# Patient Record
Sex: Female | Born: 1945
Health system: Southern US, Community
[De-identification: ages and names within clinical notes are randomized; demographics above are authoritative.]

## PROBLEM LIST (undated history)

## (undated) DIAGNOSIS — I10 Essential (primary) hypertension: Secondary | ICD-10-CM

## (undated) DIAGNOSIS — E079 Disorder of thyroid, unspecified: Secondary | ICD-10-CM

## (undated) DIAGNOSIS — E785 Hyperlipidemia, unspecified: Secondary | ICD-10-CM

## (undated) HISTORY — PX: BREAST EXCISIONAL BIOPSY: SUR124

## (undated) HISTORY — PX: BREAST BIOPSY: SHX20

---

## 2002-10-29 ENCOUNTER — Emergency Department (HOSPITAL_COMMUNITY): Admission: EM | Admit: 2002-10-29 | Discharge: 2002-10-29 | Payer: Self-pay | Admitting: *Deleted

## 2002-10-29 ENCOUNTER — Encounter: Payer: Self-pay | Admitting: *Deleted

## 2002-11-04 ENCOUNTER — Ambulatory Visit (HOSPITAL_COMMUNITY): Admission: RE | Admit: 2002-11-04 | Discharge: 2002-11-04 | Payer: Self-pay | Admitting: Family Medicine

## 2002-11-04 ENCOUNTER — Encounter: Payer: Self-pay | Admitting: Family Medicine

## 2005-09-28 ENCOUNTER — Emergency Department (HOSPITAL_COMMUNITY): Admission: EM | Admit: 2005-09-28 | Discharge: 2005-09-28 | Payer: Self-pay | Admitting: Family Medicine

## 2006-08-23 ENCOUNTER — Encounter: Admission: RE | Admit: 2006-08-23 | Discharge: 2006-08-23 | Payer: Self-pay | Admitting: Occupational Medicine

## 2006-09-13 ENCOUNTER — Encounter: Admission: RE | Admit: 2006-09-13 | Discharge: 2006-09-13 | Payer: Self-pay | Admitting: Occupational Medicine

## 2006-09-18 ENCOUNTER — Encounter: Admission: RE | Admit: 2006-09-18 | Discharge: 2006-09-18 | Payer: Self-pay | Admitting: Occupational Medicine

## 2006-09-18 ENCOUNTER — Encounter (INDEPENDENT_AMBULATORY_CARE_PROVIDER_SITE_OTHER): Payer: Self-pay | Admitting: Specialist

## 2007-02-28 ENCOUNTER — Encounter: Admission: RE | Admit: 2007-02-28 | Discharge: 2007-02-28 | Payer: Self-pay | Admitting: Family Medicine

## 2007-05-08 ENCOUNTER — Ambulatory Visit (HOSPITAL_COMMUNITY): Admission: RE | Admit: 2007-05-08 | Discharge: 2007-05-08 | Payer: Self-pay | Admitting: Family Medicine

## 2008-06-25 ENCOUNTER — Encounter: Admission: RE | Admit: 2008-06-25 | Discharge: 2008-06-25 | Payer: Self-pay | Admitting: Family Medicine

## 2009-07-15 ENCOUNTER — Encounter: Admission: RE | Admit: 2009-07-15 | Discharge: 2009-07-15 | Payer: Self-pay | Admitting: Family Medicine

## 2009-12-31 ENCOUNTER — Emergency Department (HOSPITAL_COMMUNITY): Admission: EM | Admit: 2009-12-31 | Discharge: 2009-12-31 | Payer: Self-pay | Admitting: Emergency Medicine

## 2009-12-31 ENCOUNTER — Encounter: Payer: Self-pay | Admitting: Orthopedic Surgery

## 2010-01-02 ENCOUNTER — Ambulatory Visit: Payer: Self-pay | Admitting: Orthopedic Surgery

## 2010-01-02 DIAGNOSIS — IMO0002 Reserved for concepts with insufficient information to code with codable children: Secondary | ICD-10-CM | POA: Insufficient documentation

## 2010-01-03 ENCOUNTER — Telehealth: Payer: Self-pay | Admitting: Orthopedic Surgery

## 2010-01-03 ENCOUNTER — Encounter: Payer: Self-pay | Admitting: Orthopedic Surgery

## 2010-01-04 ENCOUNTER — Telehealth (INDEPENDENT_AMBULATORY_CARE_PROVIDER_SITE_OTHER): Payer: Self-pay | Admitting: *Deleted

## 2010-01-05 ENCOUNTER — Ambulatory Visit (HOSPITAL_COMMUNITY): Admission: RE | Admit: 2010-01-05 | Discharge: 2010-01-05 | Payer: Self-pay | Admitting: Orthopedic Surgery

## 2010-01-09 ENCOUNTER — Encounter (INDEPENDENT_AMBULATORY_CARE_PROVIDER_SITE_OTHER): Payer: Self-pay | Admitting: *Deleted

## 2010-01-09 ENCOUNTER — Ambulatory Visit: Payer: Self-pay | Admitting: Orthopedic Surgery

## 2010-01-09 DIAGNOSIS — S83419A Sprain of medial collateral ligament of unspecified knee, initial encounter: Secondary | ICD-10-CM | POA: Insufficient documentation

## 2010-01-13 ENCOUNTER — Encounter: Payer: Self-pay | Admitting: Orthopedic Surgery

## 2010-01-16 ENCOUNTER — Telehealth: Payer: Self-pay | Admitting: Orthopedic Surgery

## 2010-01-24 ENCOUNTER — Encounter: Payer: Self-pay | Admitting: Orthopedic Surgery

## 2010-02-13 ENCOUNTER — Ambulatory Visit: Payer: Self-pay | Admitting: Orthopedic Surgery

## 2010-02-13 DIAGNOSIS — S83006A Unspecified dislocation of unspecified patella, initial encounter: Secondary | ICD-10-CM | POA: Insufficient documentation

## 2010-07-18 ENCOUNTER — Encounter
Admission: RE | Admit: 2010-07-18 | Discharge: 2010-07-18 | Payer: Self-pay | Source: Home / Self Care | Attending: Internal Medicine | Admitting: Internal Medicine

## 2010-08-31 NOTE — Assessment & Plan Note (Signed)
Summary: left knee torn lig/to get brace/bsf   Visit Type:  Follow-up Referring Provider:  ap er Primary Provider:  Dr. Nobie Putnam  CC:  left knee pain.  History of Present Illness: I saw Ann Ortega in the office today for a followup visit.  She is a 65 years old woman with the complaint of:  left knee.  Medications: Ibuprofen.  IMPRESSION:   1.  The dominant finding is a torn proximal portion of the medial collateral ligament.  There is adjacent indistinctness of the medial patellar retinaculum and medial patellofemoral ligament, which may also be torn. 2.  Trace knee effusion. 3.  Non fragmented osteochondral lesion along the posterior portion the lateral femoral condyle.  There is a nearby chondral defect. 4.  Non fragmented osteochondral lesion of the inferior portion lateral patellar facet. 5.  Small Baker's cyst.   Read By:  Dellia Cloud,  M.D.      Allergies: No Known Drug Allergies   Impression & Recommendations:  Problem # 1:  MEDIAL MENISCUS TEAR, LEFT (ICD-836.0) Assessment Improved  brace check  Orders: No Charge Patient Arrived (NCPA0) (NCPA0)  Problem # 2:  TEAR M C L (ICD-844.1) brace fits fine continue brace for 2 weeks if she is ready to go back to work in 2 weeks she will call and get her  note for return to work   Patient Instructions: 1)  continue brace for 5 weeks 2)  come back in 5 weeks 3)  OOW 2 more weeks

## 2010-08-31 NOTE — Progress Notes (Signed)
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 She's to bring back our [employer] forms/fee as per previous conversation for completion. Initial call taken by: Cammie Sickle,  January 04, 2010 1:28 PM

## 2010-08-31 NOTE — Letter (Signed)
Summary: History form  History form   Imported By: Jacklynn Ganong 01/12/2010 15:29:07  _____________________________________________________________________  External Attachment:    Type:   Image     Comment:   External Document

## 2010-08-31 NOTE — Assessment & Plan Note (Signed)
Summary: [facility] FOL/UP/LT KNEE SPRAIN/XR AP 12/31/09/BCBS/CAF   Vital Signs:  Patient profile:   65 year old female Height:      62 inches Weight:      151 pounds Pulse rate:   72 / minute Resp:     16 per minute  Vitals Entered By: Fuller Canada MD (January 02, 2010 11:00 AM)  Visit Type:  new patient Referring Provider:  ap er Primary Provider:  Dr. Nobie Putnam  CC:  left knee pain.  History of Present Illness: I saw Ann Ortega in the office today for an initial visit.  She is a 65 years old woman with the complaint of:  left knee pain. Grade 5 / 10 medial knee pain non radiating , associated with swelling  DOI 12/31/09 fell.  Xrays [facility] left knee 12/31/09.  Meds: Vicodin 5, Omeprazole, Mobic, Synthroid, Robaxin.       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 Cardiovascular:  Denies chest pain, palpitations, fainting, and murmurs. Respiratory:  Complains of couch; denies short of breath, wheezing, tightness, pain on inspiration, and snoring . Gastrointestinal:  Denies heartburn, nausea, vomiting, diarrhea, constipation, and blood in your stools. Genitourinary:  Denies frequency, urgency, difficulty urinating, painful urination, flank pain, and bleeding in urine. Neurologic:  Denies numbness, tingling, unsteady gait, dizziness, tremors, and seizure. Musculoskeletal:  Denies joint pain, swelling, instability, stiffness, redness, heat, and muscle pain. Endocrine:  Denies excessive thirst, exessive urination, and heat or cold intolerance. Psychiatric:  Denies nervousness, depression, anxiety, and hallucinations. Skin:  Denies changes in the skin, poor healing, rash, itching, and  redness. HEENT:  Denies blurred or double vision, eye pain, redness, and watering. Immunology:  Denies seasonal allergies, sinus problems, and allergic to bee stings. Hemoatologic:  Denies easy bleeding and brusing.  Physical Exam  Skin:  intact without lesions or rashes Inguinal Nodes:  no significant adenopathy Psych:  alert and cooperative; normal mood and affect; normal attention span and concentration   Knee Exam  General:    Well-developed, well-nourished, normal body habitus; no deformities, normal grooming.  Gait:    she is using crutches with partial weightbearing with the brace on the LEFT leg  Skin:    Intact, no scars, lesions, rashes, cafe au lait spots, or bruising.    Inspection:    ntender RIGHT knee no swelling  Swelling LEFT knee medial joint line tenderness severe  Vascular:    There was no swelling or varicose veins. The pulses and temperature are normal. There was no edema or tenderness.  Sensory:    Gross coordination and sensation were normal.    Motor:    Motor strength 5/5 bilaterally for quadriceps, hamstrings, ankle dorsiflexion, and ankle plantar flexion.    Reflexes:    Normal and symmetric patellar and Achilles reflexes bilaterally.    Knee Exam:    Right:    Inspection/Palpation:  full range of motion RIGHT knee with normal strength normal stability and no swelling or tenderness    Left:    Inspection/Palpation:  LEFT knee medial joint line tenderness severe mild effusion range of motion passively 120.  Motor exam normal.  Knee stable.      LEFT knee positive McMurray sign  Impression & Recommendations:  Problem # 1:  MEDIAL MENISCUS TEAR, LEFT (ICD-836.0) Assessment New  x-rays from the emergency room include 4 views of the LEFT knee there is mild joint arthritis.  Strong suspicion for torn medial meniscus with positive Murray sign and medial joint line tenderness with twisting injury recommend MRI LEFT knee  Orders: New  Patient Level III ([address])  Patient Instructions: 1)  Wrap style econ hinge  2)  Ice three times a day for 20 min  3)  ibuprofen 800 yid  4)  norco 5 mg 1 q 6 as needed pain  5)  MRI left knee  6)  Apply weight to the left knee as tolerated  7)  OOW until mri results are given

## 2010-08-31 NOTE — Letter (Signed)
Summary: Out of Work  [facility]  [address]  [place], [place] [address]   Phone: [phone]  Fax: [phone]    January 24, 2010   Employee:  Ann Ortega    To Whom It May Concern:   For Medical reasons, please excuse/continue out of work status regarding the above named employee from work for the following dates:  Start:   January 23, 2010  End:   February 13, 2010              Next scheduled appointment:  February 13, 2010   If you need additional information, please feel free to contact our office.         Sincerely,    Terrance Mass, MD

## 2010-08-31 NOTE — Letter (Signed)
Summary: Out of Work  [facility]  [address]  [place], [place] [address]   Phone: [phone]  Fax: [phone]    January 09, 2010   Employee:  IRANIA DURELL    To Whom It May Concern:   For Medical reasons, please excuse / continue out of work status, for the above named employee from work for the following dates:  Start:                        January 09, 2010  End/Return to work :    January 23, 2010   If you need additional information, please feel free to contact our office.         Sincerely,    Terrance Mass, MD

## 2010-08-31 NOTE — Progress Notes (Signed)
Summary: MRI appointment at [facility].  Phone Note Outgoing Call   Call placed by: Waldon Reining,  January 03, 2010 2:20 PM Call placed to: Patient Action Taken: Appt scheduled Summary of Call: I called to give the patient her MRI appointment at [facility] on 01-05-10 at 7:30 am. Patient has [employer], authorization [phone]. Patient will follow up here for results.

## 2010-08-31 NOTE — Progress Notes (Signed)
Summary: re-sent [employer] disab form and notes  Phone Note Outgoing Call   Call placed to: Insurer Summary of Call: Re-faxed [employer] dis[employer] form, per request phone msg Jones Broom, ins.representative; re-sent [employer]'s form + office notes + work note  to Fax [phone] / ph [phone] Initial call taken by: Cammie Sickle,  January 16, 2010 6:03 PM

## 2010-08-31 NOTE — Letter (Signed)
Summary: Out of Work  [facility]  [address]  [place], [place] [address]   Phone: [phone]  Fax: [phone]    February 13, 2010   Employee:  FRANKIE ZITO    To Whom It May Concern:   For Medical reasons, please note that the above named employee may return to work per the following dates:    End/Return to work, full duty, no restrictions:   02/13/10  If you need additional information, please feel free to contact our office.         Sincerely,    Terrance Mass, MD
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
## 2010-08-31 NOTE — Letter (Signed)
Summary: [employer] disability form  [employer] disability form   Imported By: Cammie Sickle 01/16/2010 18:01:43  _____________________________________________________________________  External Attachment:    Type:   Image     Comment:   External Document

## 2010-08-31 NOTE — Letter (Signed)
Summary: Out of Work  [facility]  [address]  [place], [place] [address]   Phone: [phone]  Fax: [phone]    January 02, 2010   Employee:  KAREL MOWERS    To Whom It May Concern:   For Medical reasons, please excuse the above named employee from work for the following dates:  Start: June 6th     End:   thru June 13   If you need additional information, please feel free to contact our office.         Sincerely,    Fuller Canada MD

## 2011-07-02 ENCOUNTER — Other Ambulatory Visit: Payer: Self-pay | Admitting: Internal Medicine

## 2011-07-02 DIAGNOSIS — Z1231 Encounter for screening mammogram for malignant neoplasm of breast: Secondary | ICD-10-CM

## 2011-07-27 ENCOUNTER — Ambulatory Visit: Payer: Self-pay

## 2011-08-17 ENCOUNTER — Ambulatory Visit: Payer: Self-pay

## 2013-05-26 ENCOUNTER — Other Ambulatory Visit: Payer: Self-pay

## 2013-05-26 DIAGNOSIS — Z1231 Encounter for screening mammogram for malignant neoplasm of breast: Secondary | ICD-10-CM

## 2013-06-24 ENCOUNTER — Ambulatory Visit: Payer: Self-pay

## 2013-08-07 ENCOUNTER — Ambulatory Visit
Admission: RE | Admit: 2013-08-07 | Discharge: 2013-08-07 | Disposition: A | Discharge status: Home / Self Care | Payer: Medicare Other | Source: Ambulatory Visit

## 2013-08-07 DIAGNOSIS — Z1231 Encounter for screening mammogram for malignant neoplasm of breast: Secondary | ICD-10-CM

## 2013-11-14 ENCOUNTER — Encounter (HOSPITAL_COMMUNITY): Payer: Self-pay | Admitting: Emergency Medicine

## 2013-11-14 ENCOUNTER — Emergency Department (HOSPITAL_COMMUNITY)
Admission: EM | Admit: 2013-11-14 | Discharge: 2013-11-14 | Disposition: A | Discharge status: Home / Self Care | Payer: BC Managed Care – PPO | Attending: Emergency Medicine | Admitting: Emergency Medicine

## 2013-11-14 DIAGNOSIS — IMO0002 Reserved for concepts with insufficient information to code with codable children: Secondary | ICD-10-CM

## 2013-11-14 DIAGNOSIS — T300 Burn of unspecified body region, unspecified degree: Secondary | ICD-10-CM

## 2013-11-14 DIAGNOSIS — T24229A Burn of second degree of unspecified knee, initial encounter: Secondary | ICD-10-CM | POA: Diagnosis not present

## 2013-11-14 DIAGNOSIS — T23119A Burn of first degree of unspecified thumb (nail), initial encounter: Secondary | ICD-10-CM | POA: Insufficient documentation

## 2013-11-14 DIAGNOSIS — T25229A Burn of second degree of unspecified foot, initial encounter: Secondary | ICD-10-CM | POA: Insufficient documentation

## 2013-11-14 DIAGNOSIS — Y9389 Activity, other specified: Secondary | ICD-10-CM | POA: Diagnosis not present

## 2013-11-14 DIAGNOSIS — Z8639 Personal history of other endocrine, nutritional and metabolic disease: Secondary | ICD-10-CM | POA: Insufficient documentation

## 2013-11-14 DIAGNOSIS — X19XXXA Contact with other heat and hot substances, initial encounter: Secondary | ICD-10-CM | POA: Insufficient documentation

## 2013-11-14 DIAGNOSIS — Z862 Personal history of diseases of the blood and blood-forming organs and certain disorders involving the immune mechanism: Secondary | ICD-10-CM | POA: Insufficient documentation

## 2013-11-14 DIAGNOSIS — Y92009 Unspecified place in unspecified non-institutional (private) residence as the place of occurrence of the external cause: Secondary | ICD-10-CM | POA: Diagnosis not present

## 2013-11-14 HISTORY — DX: Disorder of thyroid, unspecified: E07.9

## 2013-11-14 MED ORDER — OXYCODONE-ACETAMINOPHEN 5-325 MG PO TABS: 2.0000 | ORAL_TABLET | ORAL | Status: DC | PRN

## 2013-11-14 MED ORDER — SILVER SULFADIAZINE 1 % EX CREA
TOPICAL_CREAM | Freq: Once | CUTANEOUS | Status: AC
  Administered 2013-11-14: 03:00:00 via TOPICAL
  Filled 2013-11-14: qty 50

## 2013-11-14 MED ORDER — SILVER SULFADIAZINE 1 % EX CREA: 1.0000 "application " | TOPICAL_CREAM | Freq: Every day | CUTANEOUS | Status: DC

## 2013-11-14 MED ORDER — ONDANSETRON HCL 4 MG/2ML IJ SOLN
4.0000 mg | Freq: Once | INTRAMUSCULAR | Status: AC
  Administered 2013-11-14: 4 mg via INTRAVENOUS
  Filled 2013-11-14: qty 2

## 2013-11-14 MED ORDER — HYDROMORPHONE HCL PF 1 MG/ML IJ SOLN
1.0000 mg | Freq: Once | INTRAMUSCULAR | Status: AC
  Administered 2013-11-14: 1 mg via INTRAVENOUS
  Filled 2013-11-14: qty 1

## 2013-11-14 MED ORDER — OXYCODONE-ACETAMINOPHEN 5-325 MG PO TABS
2.0000 | ORAL_TABLET | Freq: Once | ORAL | Status: AC
  Administered 2013-11-14: 2 via ORAL
  Filled 2013-11-14: qty 2
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
## 2013-11-14 NOTE — Discharge Instructions (Signed)
Second-Degree Burn Apply Silvadene ointment twice daily as instructed. Take pain medications as needed. Follow up on Monday in the wound clinic as directed. He evaluated immediately for any evidence of infection.   A second-degree burn affects the 2 outer layers of skin. The outer layer (epidermis) and the layer underneath it (dermis) are both burned. Another name for this type of burn is a partial thickness burn. A second-degree burn may be called minor or major. This depends on the size of the burn. It also depends on what parts of the skin are burned. Minor burns may be treated with first aid. Major burns are a medical emergency. A second-degree burn is worse than a first-degree burn, but not as bad as a third-degree burn. A first-degree burn affects only the epidermis. A third-degree burn goes through all the layers of skin. A second-degree burn usually heals in 3 to 4 weeks. A minor second-degree burn usually does not leave a scar.Deeper second-degree burns may lead to scarring of the skin or contractures over joints.Contractures are scars that form over joints and may lead to reduced mobility at those joints. CAUSES  Heat (thermal) injury. This happens when skin comes in contact with something very hot. It could be a flame, a hot object, hot liquid, or steam. Most second-degree burns are thermal injuries.  Radiation. Sunlight is one type of radiation that can burn the skin. Another type of radiation is used to heat food. Radiation is also used to treat some diseases, such as cancer. All types of radiation can burn the skin. Sunlight usually causes a first-degree burn. Radiation used for heating food or treating a disease can cause a second-degree burn.  Electricity. Electrical burns can cause more damage under the skin than on the surface. They should always be treated as major burns.  Chemicals. Many chemicals can burn the skin. The burn should be flushed with cool water and checked by an  emergency caregiver. SYMPTOMS Symptoms of second-degree burns include:  Severe pain.  Extreme tenderness.  Deep redness.  Blistered skin.  Skin that has changed color.It might look blotchy, wet, or shiny.  Swelling. TREATMENT Some second-degree burns may need to be treated in a hospital. These include major burns, electrical burns, and chemical burns. Many other second-degree burns can be treated with regular first aid, such as:  Cooling the burn. Use cool, germ-free (sterile) salt water. Place the burned area of skin into a tub of water, or cover the burned area with clean, wet towels.  Taking pain medicine.  Removing the dead skin from broken blisters. A trained caregiver may do this. Do not pop blisters.  Gently washing your skin with mild soap.  Covering the burned area with a cream.Silver sulfadiazine is a cream for burns. An antibiotic cream, such as bacitracin, may also be used to fight infection. Do not use other ointments or creams unless your caregiver says it is okay.  Protecting the burn with a sterile, non-sticky bandage.  Bandaging fingers and toes separately. This keeps them from sticking together.  Taking an antibiotic. This can help prevent infection.  Getting a tetanus shot. HOME CARE INSTRUCTIONS Medication  Take any medicine prescribed by your caregiver. Follow the directions carefully.  Ask your caregiver if you can take over-the-counter medicine to relieve pain and swelling. Do not give aspirin to children.  Make sure your caregiver knows about all other medicines you take.This includes over-the-counter medicines. Burn care  You will need to change the bandage on your  burn. You may need to do this 2 or 3 times each day.  Gently clean the burned area.  Put ointment on it.  Cover the burn with a sterile bandage.  For some deeper burns or burns that cover a large area, compression garments may be prescribed. These garments can help minimize  scarring and protect your mobility.  Do not put butter or oil on your skin. Use only the cream prescribed by your caregiver.  Do not put ice on your burn.  Do not break blisters on your skin.  Keep the bandaged area dry. You might need to take a sponge bath for awhile.Ask your caregiver when you can take a shower or a tub bath again.  Do not scratch an itchy burn. Your caregiver may give you medicine to relieve very bad itching.  Infection is a big danger after a second-degree burn. Tell your caregiver right away if you have signs of infection, such as:  Redness or changing color in the burned area.  Fluid leaking from the burn.  Swelling in the burn area.  A bad smell coming from the wound. Follow-up  Keep all follow-up appointments.This is important. This is how your caregiver can tell if your treatment is working.  Protect your burn from sunlight.Use sunscreen whenever you go outside.Burned areas may be sensitive to the sun for up to 1 year. Exposure to the sun may also cause permanent darkening of scars. SEEK MEDICAL CARE IF:  You have any questions about medicines.  You have any questions about your treatment.  You wonder if it is okay to do a particular activity.  You develop a fever of more than 100.5 F (38.1 C). SEEK IMMEDIATE MEDICAL CARE IF:  You think your burn might be infected. It may change color, become red, leak fluid, swell, or smell bad.  You develop a fever of more than 102 F (38.9 C). Document Released: 12/18/2010 Document Revised: 10/08/2011 Document Reviewed: 12/18/2010 [facility] Patient Information 2014 [place], [place].

## 2014-10-19 ENCOUNTER — Other Ambulatory Visit: Payer: Self-pay

## 2014-10-19 DIAGNOSIS — Z1231 Encounter for screening mammogram for malignant neoplasm of breast: Secondary | ICD-10-CM

## 2014-11-05 ENCOUNTER — Ambulatory Visit
Admission: RE | Admit: 2014-11-05 | Discharge: 2014-11-05 | Disposition: A | Discharge status: Home / Self Care | Payer: BLUE CROSS/BLUE SHIELD | Source: Ambulatory Visit

## 2014-11-05 DIAGNOSIS — Z1231 Encounter for screening mammogram for malignant neoplasm of breast: Secondary | ICD-10-CM

## 2014-11-10 ENCOUNTER — Other Ambulatory Visit (HOSPITAL_COMMUNITY): Payer: Self-pay | Admitting: Physician Assistant

## 2014-11-10 DIAGNOSIS — Z0000 Encounter for general adult medical examination without abnormal findings: Secondary | ICD-10-CM

## 2014-11-16 ENCOUNTER — Other Ambulatory Visit: Payer: Self-pay | Admitting: Physician Assistant

## 2014-11-16 DIAGNOSIS — Z0000 Encounter for general adult medical examination without abnormal findings: Secondary | ICD-10-CM

## 2014-11-25 ENCOUNTER — Ambulatory Visit (INDEPENDENT_AMBULATORY_CARE_PROVIDER_SITE_OTHER): Payer: BLUE CROSS/BLUE SHIELD | Admitting: Orthopedic Surgery

## 2014-11-25 ENCOUNTER — Encounter: Payer: Self-pay | Admitting: Orthopedic Surgery

## 2014-11-25 ENCOUNTER — Ambulatory Visit (INDEPENDENT_AMBULATORY_CARE_PROVIDER_SITE_OTHER): Payer: BLUE CROSS/BLUE SHIELD

## 2014-11-25 VITALS — BP 142/75 | Ht 62.0 in | Wt 142.0 lb

## 2014-11-25 DIAGNOSIS — M6248 Contracture of muscle, other site: Secondary | ICD-10-CM

## 2014-11-25 DIAGNOSIS — M25512 Pain in left shoulder: Secondary | ICD-10-CM

## 2014-11-25 DIAGNOSIS — M25462 Effusion, left knee: Secondary | ICD-10-CM | POA: Diagnosis not present

## 2014-11-25 DIAGNOSIS — M542 Cervicalgia: Secondary | ICD-10-CM | POA: Diagnosis not present

## 2014-11-25 DIAGNOSIS — M62838 Other muscle spasm: Secondary | ICD-10-CM

## 2014-11-25 MED ORDER — CYCLOBENZAPRINE HCL 5 MG PO TABS
5.0000 mg | ORAL_TABLET | Freq: Every day | ORAL | Status: DC
Start: 2014-11-25 — End: 2018-07-07

## 2014-11-25 NOTE — Progress Notes (Signed)
Patient ID: Ann Ortega, female   DOB: [birth year], 69 y.o.   MRN: [id number]  Chief Complaint  Patient presents with  . Shoulder Pain    Left shoulder pain, no injury.    Ann Ortega is a 69 y.o. female.   HPI 69 year old female presents with "shoulder pain" for one month no trauma. History of previous right rotator cuff repair with good result. Complains of pain and aching in the cervical spine and periscapular region of the left shoulder which is worse after activity and partially relieved with naproxen  She also complains of swelling of her left knee after repeatedly getting in and out of a van. Previous history of evaluation for left knee no surgery needed Review of Systems Pertinent system review includes history of seasonal allergies joint and limb pain with muscle weakness bowel bladder function are normal endocrine function normal denies numbness tingling burning pain in her legs denies any weakness  In terms of previous surgeries she reports back surgery breast surgery right rotator cuff repair  Family history is negative  Past Medical History  Diagnosis Date  . Thyroid disease     No past surgical history on file.  No family history on file.  Social History History  Substance Use Topics  . Smoking status: Never Smoker   . Smokeless tobacco: Not on file  . Alcohol Use: No    No Known Allergies  Current Outpatient Prescriptions  Medication Sig Dispense Refill  . cyclobenzaprine (FLEXERIL) 5 MG tablet Take 1 tablet (5 mg total) by mouth at bedtime. 60 tablet 0  . oxyCODONE-acetaminophen (PERCOCET/ROXICET) 5-325 MG per tablet Take 2 tablets by mouth every 4 (four) hours as needed for severe pain. 15 tablet 0  . silver sulfADIAZINE (SILVADENE) 1 % cream Apply 1 application topically daily. 50 g 0   No current facility-administered medications for this visit.       Physical Exam Blood pressure 142/75, height 5\' 2"  (1.575 m), weight 142 lb (64.411 kg). Physical  Exam The patient is well developed well nourished and well groomed. Orientation to person place and time is normal  Mood is pleasant. Ambulatory status she walks without any difficulty there is no weakness or altered gait pattern to suggest any myelopathy She has full range of motion stability strength on the left shoulder with no tenderness  She has periscapular tenderness trapezoid tenderness levator scapula tenderness and neck tenderness without neck stiffness and negative Spurling sign is noted. Skin normal reflexes 2+ symmetric and equal normal sensation in both hands and 2+ radial pulses bilaterally  Left knee was indeed swollen she has no motion loss and no limp Data Reviewed Imaging left shoulder film shows some chronic rotator cuff changes see report independently reviewed by me and independently read by me  Assessment Encounter Diagnoses  Name Primary?  . Left shoulder pain   . Cervicalgia Yes  . Muscle spasms of neck   . Knee swelling, left     Plan Recommend continue naproxen daily at physical therapy and a Flexeril 5 mg daily at bedtime Follow-up as needed

## 2014-11-25 NOTE — Patient Instructions (Addendum)
CALL TO [facility] THERAPY AT [facility]  NEW MEDICATION SENT TO PHARMACY (FLEXERIL) MUSCLE RELAXER  CONTINUE NAPROXEN

## 2014-12-24 ENCOUNTER — Other Ambulatory Visit: Payer: Self-pay | Admitting: Physician Assistant

## 2014-12-24 DIAGNOSIS — E2839 Other primary ovarian failure: Secondary | ICD-10-CM

## 2014-12-31 ENCOUNTER — Ambulatory Visit
Admission: RE | Admit: 2014-12-31 | Discharge: 2014-12-31 | Disposition: A | Discharge status: Home / Self Care | Payer: Medicare Other | Source: Ambulatory Visit | Attending: Physician Assistant | Admitting: Physician Assistant

## 2014-12-31 DIAGNOSIS — M8588 Other specified disorders of bone density and structure, other site: Secondary | ICD-10-CM | POA: Diagnosis not present

## 2014-12-31 DIAGNOSIS — E2839 Other primary ovarian failure: Secondary | ICD-10-CM

## 2016-01-10 ENCOUNTER — Other Ambulatory Visit (HOSPITAL_COMMUNITY): Payer: Self-pay | Admitting: Registered Nurse

## 2016-01-10 ENCOUNTER — Ambulatory Visit (HOSPITAL_COMMUNITY)
Admission: RE | Admit: 2016-01-10 | Discharge: 2016-01-10 | Disposition: A | Discharge status: Home / Self Care | Payer: BLUE CROSS/BLUE SHIELD | Source: Ambulatory Visit | Attending: Registered Nurse | Admitting: Registered Nurse

## 2016-01-10 DIAGNOSIS — M5431 Sciatica, right side: Secondary | ICD-10-CM | POA: Insufficient documentation

## 2016-01-10 DIAGNOSIS — R938 Abnormal findings on diagnostic imaging of other specified body structures: Secondary | ICD-10-CM | POA: Diagnosis not present

## 2016-01-10 DIAGNOSIS — M5432 Sciatica, left side: Secondary | ICD-10-CM

## 2016-01-17 ENCOUNTER — Other Ambulatory Visit: Payer: Self-pay | Admitting: Physician Assistant

## 2016-01-17 DIAGNOSIS — Z1231 Encounter for screening mammogram for malignant neoplasm of breast: Secondary | ICD-10-CM

## 2016-02-01 ENCOUNTER — Encounter: Payer: Self-pay | Admitting: Orthopaedic Surgery

## 2016-02-01 ENCOUNTER — Ambulatory Visit (INDEPENDENT_AMBULATORY_CARE_PROVIDER_SITE_OTHER): Payer: BLUE CROSS/BLUE SHIELD | Admitting: Orthopaedic Surgery

## 2016-02-01 ENCOUNTER — Ambulatory Visit (INDEPENDENT_AMBULATORY_CARE_PROVIDER_SITE_OTHER): Payer: BLUE CROSS/BLUE SHIELD

## 2016-02-01 VITALS — BP 163/87 | HR 69 | Temp 97.5°F | Ht 62.0 in | Wt 150.4 lb

## 2016-02-01 DIAGNOSIS — M25552 Pain in left hip: Secondary | ICD-10-CM

## 2016-02-01 MED ORDER — PREDNISONE 10 MG (21) PO TBPK: ORAL_TABLET | ORAL | Status: DC

## 2016-02-01 NOTE — Progress Notes (Signed)
Patient [id number] Ann Ortega, female DOB:29-Sep-1945, 70 y.o. [id number]  Chief Complaint  Patient presents with  . Establish Care    HPI  Ann Ortega is a 70 y.o. female who is a patient of Dr. Aline Brochure who is having pain in the left hip and buttocks area.  It has been present about a month and getting worse.  She has pain after sitting down a long time or sitting in a car and driving more than 30 minutes.  It is deep and radiates to the anterior thigh on the left.  She has no swelling, no redness.  She has not gotten better with rest, ice or heat.  She has no trauma, no other joints that hurt.  HPI  Body mass index is 27.5 kg/(m^2).  ROS  Review of Systems  HENT: Negative for congestion.   Respiratory: Negative for cough and shortness of breath.   Cardiovascular: Negative for chest pain and leg swelling.  Endocrine: Negative for cold intolerance.  Musculoskeletal: Positive for arthralgias and gait problem.  Allergic/Immunologic: Negative for environmental allergies.    Past Medical History  Diagnosis Date  . Thyroid disease     History reviewed. No pertinent past surgical history.  History reviewed. No pertinent family history.  Social History Social History  Substance Use Topics  . Smoking status: Never Smoker   . Smokeless tobacco: None  . Alcohol Use: No    No Known Allergies  Current Outpatient Prescriptions  Medication Sig Dispense Refill  . cyclobenzaprine (FLEXERIL) 5 MG tablet Take 1 tablet (5 mg total) by mouth at bedtime. 60 tablet 0  . oxyCODONE-acetaminophen (PERCOCET/ROXICET) 5-325 MG per tablet Take 2 tablets by mouth every 4 (four) hours as needed for severe pain. 15 tablet 0  . silver sulfADIAZINE (SILVADENE) 1 % cream Apply 1 application topically daily. 50 g 0  . predniSONE (STERAPRED UNI-PAK 21 TAB) 10 MG (21) TBPK tablet Take six pills the first day;5 pills the next day;4 pills the next day;3 pills the next day; 2 pills the next day,one the final day.  21 tablet 1   No current facility-administered medications for this visit.     Physical Exam  Blood pressure 163/87, pulse 69, temperature 97.5 F (36.4 C), height 5\' 2"  (1.575 m), weight 150 lb 6 oz (68.21 kg).  Constitutional: overall normal hygiene, normal nutrition, well developed, normal grooming, normal body habitus. Assistive device:none  Musculoskeletal: gait and station Limp none, muscle tone and strength are normal, no tremors or atrophy is present.  .  Neurological: coordination overall normal.  Deep tendon reflex/nerve stretch intact.  Sensation normal.  Cranial nerves II-XII intact.   Skin:   normal overall no scars, lesions, ulcers or rashes. No psoriasis.  Psychiatric: Alert and oriented x 3.  Recent memory intact, remote memory unclear.  Normal mood and affect. Well groomed.  Good eye contact.  Cardiovascular: overall no swelling, no varicosities, no edema bilaterally, normal temperatures of the legs and arms, no clubbing, cyanosis and good capillary refill.  Lymphatic: palpation is normal.  She is tender over the left ischium area of the buttocks area.  She has normal gait, full ROM of both hips and negative SLR.    Spine/Pelvis examination:  Inspection:  Overall, sacoiliac joint benign and hips nontender; without crepitus or defects.   Thoracic spine inspection: Alignment normal without kyphosis present   Lumbar spine inspection:  Alignment  with normal lumbar lordosis, without scoliosis apparent.   Thoracic spine palpation:  without tenderness  of spinal processes   Lumbar spine palpation: without tenderness of lumbar area; without tightness of lumbar muscles    Range of Motion:   Lumbar flexion, forward flexion is full  without pain or tenderness    Lumbar extension is full  without pain or tenderness   Left lateral bend is Normal  without pain or tenderness   Right lateral bend is Normal without pain or tenderness   Straight leg raising is  Normal   Strength & tone: Normal   Stability overall normal stability     The patient has been educated about the nature of the problem(s) and counseled on treatment options.  The patient appeared to understand what I have discussed and is in agreement with it.  Encounter Diagnosis  Name Primary?  . Left hip pain Yes    PLAN Call if any problems.  Precautions discussed.  Continue current medications.   Return to clinic 2 weeks   Begin Naprosyn.  Electronically Signed Sanjuana Kava, MD 7/5/201711:36 AM

## 2016-02-03 ENCOUNTER — Ambulatory Visit
Admission: RE | Admit: 2016-02-03 | Discharge: 2016-02-03 | Disposition: A | Discharge status: Home / Self Care | Payer: BLUE CROSS/BLUE SHIELD | Source: Ambulatory Visit | Attending: Physician Assistant | Admitting: Physician Assistant

## 2016-02-03 DIAGNOSIS — Z1231 Encounter for screening mammogram for malignant neoplasm of breast: Secondary | ICD-10-CM

## 2016-02-08 ENCOUNTER — Telehealth: Payer: Self-pay | Admitting: Orthopaedic Surgery

## 2016-02-08 NOTE — Telephone Encounter (Addendum)
Patient called and said that she had finished the Prednisone(after the 6 days) and that yesterday she took over the counter Naprosyn 220 mgs.  Neither of these medications have helped her.  She wants to know what else can she take or do.  Please advise  Ann Ortega called this morning and I told her what Dr. Luna Glasgow said.  I offered to have her come in today but she said she had to work at 3:00.  She wanted to wait until next week so I have given her an appointment for next Tuesday, February 14, 2016.

## 2016-02-09 NOTE — Telephone Encounter (Signed)
I may have to see her again.  They should have helped.

## 2016-02-14 ENCOUNTER — Ambulatory Visit (INDEPENDENT_AMBULATORY_CARE_PROVIDER_SITE_OTHER): Payer: BLUE CROSS/BLUE SHIELD | Admitting: Orthopaedic Surgery

## 2016-02-14 ENCOUNTER — Encounter: Payer: Self-pay | Admitting: Orthopaedic Surgery

## 2016-02-14 VITALS — BP 129/69 | HR 66 | Temp 97.3°F | Ht 62.5 in | Wt 149.8 lb

## 2016-02-14 DIAGNOSIS — M25552 Pain in left hip: Secondary | ICD-10-CM | POA: Diagnosis not present

## 2016-02-14 DIAGNOSIS — M25551 Pain in right hip: Secondary | ICD-10-CM

## 2016-02-14 MED ORDER — METHOCARBAMOL 500 MG PO TABS: 500.0000 mg | ORAL_TABLET | Freq: Three times a day (TID) | ORAL | Status: DC

## 2016-02-14 MED ORDER — HYDROCODONE-ACETAMINOPHEN 5-325 MG PO TABS: 1.0000 | ORAL_TABLET | ORAL | Status: DC | PRN

## 2016-02-14 NOTE — Progress Notes (Signed)
Patient Ann Ortega, female DOB:Oct 06, 1945, 70 y.o. OZD:[id number]  Chief Complaint  Patient presents with  . Follow-up    Left hip    HPI  Ann Ortega is a 70 y.o. female who has pain of the left ischium area and now of the right ischium area.  The prednisone did not help that much.  She has been sitting more.  She plans to go to [facility] by train next week and is concerned about sitting so long.  She has no trauma, no paresthesias. She has no bowel or bladder problems.  I will give Norco 5 and Robaxin 500.  I have explained possible side effects and how to take.  She is to continue to be active.  HPI  Body mass index is 26.95 kg/(m^2).  ROS  Review of Systems  HENT: Negative for congestion.   Respiratory: Negative for cough and shortness of breath.   Cardiovascular: Negative for chest pain and leg swelling.  Endocrine: Negative for cold intolerance.  Musculoskeletal: Positive for arthralgias and gait problem.  Allergic/Immunologic: Negative for environmental allergies.    Past Medical History  Diagnosis Date  . Thyroid disease     No past surgical history on file.  No family history on file.  Social History Social History  Substance Use Topics  . Smoking status: Never Smoker   . Smokeless tobacco: Not on file  . Alcohol Use: No    No Known Allergies  Current Outpatient Prescriptions  Medication Sig Dispense Refill  . gabapentin (NEURONTIN) 100 MG capsule   0  . levothyroxine (SYNTHROID, LEVOTHROID) 50 MCG tablet   0  . triamcinolone cream (KENALOG) 0.5 %   0  . cyclobenzaprine (FLEXERIL) 5 MG tablet Take 1 tablet (5 mg total) by mouth at bedtime. (Patient not taking: Reported on 02/14/2016) 60 tablet 0  . HYDROcodone-acetaminophen (NORCO/VICODIN) 5-325 MG tablet Take 1 tablet by mouth every 4 (four) hours as needed for moderate pain (Must last 15 days.Do not take and drive a car or use machinery.). 60 tablet 0  . methocarbamol (ROBAXIN) 500 MG tablet Take 1  tablet (500 mg total) by mouth 3 (three) times daily. 60 tablet 1  . predniSONE (STERAPRED UNI-PAK 21 TAB) 10 MG (21) TBPK tablet Take six pills the first day;5 pills the next day;4 pills the next day;3 pills the next day; 2 pills the next day,one the final day. (Patient not taking: Reported on 02/14/2016) 21 tablet 1  . silver sulfADIAZINE (SILVADENE) 1 % cream Apply 1 application topically daily. (Patient not taking: Reported on 02/14/2016) 50 g 0   No current facility-administered medications for this visit.     Physical Exam  Blood pressure 129/69, pulse 66, temperature 97.3 F (36.3 C), height 5' 2.5" (1.588 m), weight 149 lb 12.8 oz (67.949 kg).  Constitutional: overall normal hygiene, normal nutrition, well developed, normal grooming, normal body habitus. Assistive device:none  Musculoskeletal: gait and station Limp none, muscle tone and strength are normal, no tremors or atrophy is present.  .  Neurological: coordination overall normal.  Deep tendon reflex/nerve stretch intact.  Sensation normal.  Cranial nerves II-XII intact.   Skin:   normal overall no scars, lesions, ulcers or rashes. No psoriasis.  Psychiatric: Alert and oriented x 3.  Recent memory intact, remote memory unclear.  Normal mood and affect. Well groomed.  Good eye contact.  Cardiovascular: overall no swelling, no varicosities, no edema bilaterally, normal temperatures of the legs and arms, no clubbing, cyanosis and good  capillary refill.  Lymphatic: palpation is normal.  Both hips have full motion, NV intact.  She has no pain of the trochanteric areas. She has no swelling.  Gait is normal.  Leg lengths normal.  The patient has been educated about the nature of the problem(s) and counseled on treatment options.  The patient appeared to understand what I have discussed and is in agreement with it.  Left and right hip pain.  PLAN Call if any problems.  Precautions discussed.  Continue current medications.    Return to clinic 2 weeks   Electronically Signed Sanjuana Kava, MD 7/18/201710:35 AM

## 2016-02-15 ENCOUNTER — Ambulatory Visit: Payer: BLUE CROSS/BLUE SHIELD | Admitting: Orthopaedic Surgery

## 2016-02-28 ENCOUNTER — Ambulatory Visit: Payer: BLUE CROSS/BLUE SHIELD | Admitting: Orthopaedic Surgery

## 2016-11-09 DIAGNOSIS — E063 Autoimmune thyroiditis: Secondary | ICD-10-CM | POA: Diagnosis not present

## 2016-11-09 DIAGNOSIS — Z1389 Encounter for screening for other disorder: Secondary | ICD-10-CM | POA: Diagnosis not present

## 2016-11-09 DIAGNOSIS — Z6828 Body mass index (BMI) 28.0-28.9, adult: Secondary | ICD-10-CM | POA: Diagnosis not present

## 2016-11-09 DIAGNOSIS — E663 Overweight: Secondary | ICD-10-CM | POA: Diagnosis not present

## 2016-11-09 DIAGNOSIS — K219 Gastro-esophageal reflux disease without esophagitis: Secondary | ICD-10-CM | POA: Diagnosis not present

## 2016-11-09 DIAGNOSIS — I10 Essential (primary) hypertension: Secondary | ICD-10-CM | POA: Diagnosis not present

## 2016-11-09 DIAGNOSIS — E039 Hypothyroidism, unspecified: Secondary | ICD-10-CM | POA: Diagnosis not present

## 2016-11-14 DIAGNOSIS — M5136 Other intervertebral disc degeneration, lumbar region: Secondary | ICD-10-CM | POA: Diagnosis not present

## 2016-11-14 DIAGNOSIS — M5410 Radiculopathy, site unspecified: Secondary | ICD-10-CM | POA: Diagnosis not present

## 2016-11-14 DIAGNOSIS — Z6828 Body mass index (BMI) 28.0-28.9, adult: Secondary | ICD-10-CM | POA: Diagnosis not present

## 2016-11-14 DIAGNOSIS — M545 Low back pain: Secondary | ICD-10-CM | POA: Diagnosis not present

## 2017-01-25 DIAGNOSIS — M25512 Pain in left shoulder: Secondary | ICD-10-CM | POA: Diagnosis not present

## 2017-01-25 DIAGNOSIS — Z6828 Body mass index (BMI) 28.0-28.9, adult: Secondary | ICD-10-CM | POA: Diagnosis not present

## 2017-01-25 DIAGNOSIS — E063 Autoimmune thyroiditis: Secondary | ICD-10-CM | POA: Diagnosis not present

## 2017-01-25 DIAGNOSIS — E669 Obesity, unspecified: Secondary | ICD-10-CM | POA: Diagnosis not present

## 2017-01-25 DIAGNOSIS — Z0001 Encounter for general adult medical examination with abnormal findings: Secondary | ICD-10-CM | POA: Diagnosis not present

## 2017-01-25 DIAGNOSIS — E049 Nontoxic goiter, unspecified: Secondary | ICD-10-CM | POA: Diagnosis not present

## 2017-01-25 DIAGNOSIS — K219 Gastro-esophageal reflux disease without esophagitis: Secondary | ICD-10-CM | POA: Diagnosis not present

## 2017-01-29 ENCOUNTER — Other Ambulatory Visit: Payer: Self-pay | Admitting: Physician Assistant

## 2017-01-29 DIAGNOSIS — Z1231 Encounter for screening mammogram for malignant neoplasm of breast: Secondary | ICD-10-CM

## 2017-01-31 ENCOUNTER — Other Ambulatory Visit: Payer: Self-pay | Admitting: Family Medicine

## 2017-01-31 DIAGNOSIS — Z1231 Encounter for screening mammogram for malignant neoplasm of breast: Secondary | ICD-10-CM

## 2017-01-31 DIAGNOSIS — E2839 Other primary ovarian failure: Secondary | ICD-10-CM

## 2017-02-01 DIAGNOSIS — E039 Hypothyroidism, unspecified: Secondary | ICD-10-CM | POA: Diagnosis not present

## 2017-02-01 DIAGNOSIS — R221 Localized swelling, mass and lump, neck: Secondary | ICD-10-CM | POA: Diagnosis not present

## 2017-02-15 ENCOUNTER — Ambulatory Visit
Admission: RE | Admit: 2017-02-15 | Discharge: 2017-02-15 | Disposition: A | Discharge status: Home / Self Care | Payer: Medicare Other | Source: Ambulatory Visit | Attending: Family Medicine | Admitting: Family Medicine

## 2017-02-15 ENCOUNTER — Ambulatory Visit
Admission: RE | Admit: 2017-02-15 | Discharge: 2017-02-15 | Disposition: A | Discharge status: Home / Self Care | Source: Ambulatory Visit | Attending: Family Medicine | Admitting: Family Medicine

## 2017-02-15 DIAGNOSIS — Z1231 Encounter for screening mammogram for malignant neoplasm of breast: Secondary | ICD-10-CM | POA: Diagnosis not present

## 2017-02-15 DIAGNOSIS — M8589 Other specified disorders of bone density and structure, multiple sites: Secondary | ICD-10-CM | POA: Diagnosis not present

## 2017-02-15 DIAGNOSIS — E2839 Other primary ovarian failure: Secondary | ICD-10-CM

## 2017-02-15 DIAGNOSIS — Z780 Asymptomatic menopausal state: Secondary | ICD-10-CM | POA: Diagnosis not present

## 2017-02-18 DIAGNOSIS — M1990 Unspecified osteoarthritis, unspecified site: Secondary | ICD-10-CM | POA: Diagnosis not present

## 2017-02-18 DIAGNOSIS — M2550 Pain in unspecified joint: Secondary | ICD-10-CM | POA: Diagnosis not present

## 2017-02-18 DIAGNOSIS — E663 Overweight: Secondary | ICD-10-CM | POA: Diagnosis not present

## 2017-02-18 DIAGNOSIS — Z6828 Body mass index (BMI) 28.0-28.9, adult: Secondary | ICD-10-CM | POA: Diagnosis not present

## 2017-03-22 DIAGNOSIS — K640 First degree hemorrhoids: Secondary | ICD-10-CM | POA: Diagnosis not present

## 2017-03-22 DIAGNOSIS — Z1211 Encounter for screening for malignant neoplasm of colon: Secondary | ICD-10-CM | POA: Diagnosis not present

## 2017-03-22 DIAGNOSIS — K635 Polyp of colon: Secondary | ICD-10-CM | POA: Diagnosis not present

## 2017-03-22 DIAGNOSIS — D126 Benign neoplasm of colon, unspecified: Secondary | ICD-10-CM | POA: Diagnosis not present

## 2017-03-27 DIAGNOSIS — K635 Polyp of colon: Secondary | ICD-10-CM | POA: Diagnosis not present

## 2017-03-27 DIAGNOSIS — D126 Benign neoplasm of colon, unspecified: Secondary | ICD-10-CM | POA: Diagnosis not present

## 2017-08-04 ENCOUNTER — Emergency Department (HOSPITAL_COMMUNITY): Payer: Medicare Other

## 2017-08-04 ENCOUNTER — Encounter (HOSPITAL_COMMUNITY): Payer: Self-pay | Admitting: *Deleted

## 2017-08-04 ENCOUNTER — Emergency Department (HOSPITAL_COMMUNITY)
Admission: EM | Admit: 2017-08-04 | Discharge: 2017-08-04 | Disposition: A | Discharge status: Home / Self Care | Payer: Medicare Other | Attending: Emergency Medicine | Admitting: Emergency Medicine

## 2017-08-04 DIAGNOSIS — K2900 Acute gastritis without bleeding: Secondary | ICD-10-CM | POA: Insufficient documentation

## 2017-08-04 DIAGNOSIS — N281 Cyst of kidney, acquired: Secondary | ICD-10-CM | POA: Diagnosis not present

## 2017-08-04 DIAGNOSIS — R1011 Right upper quadrant pain: Secondary | ICD-10-CM | POA: Diagnosis not present

## 2017-08-04 DIAGNOSIS — R109 Unspecified abdominal pain: Secondary | ICD-10-CM | POA: Diagnosis not present

## 2017-08-04 DIAGNOSIS — Z79899 Other long term (current) drug therapy: Secondary | ICD-10-CM | POA: Insufficient documentation

## 2017-08-04 LAB — COMPREHENSIVE METABOLIC PANEL
ALBUMIN: 4.1 g/dL (ref 3.5–5.0)
ALT: 30 U/L (ref 14–54)
ANION GAP: 8 (ref 5–15)
AST: 25 U/L (ref 15–41)
Alkaline Phosphatase: 72 U/L (ref 38–126)
BUN: 9 mg/dL (ref 6–20)
CHLORIDE: 108 mmol/L (ref 101–111)
CO2: 26 mmol/L (ref 22–32)
Calcium: 9.3 mg/dL (ref 8.9–10.3)
Creatinine, Ser: 0.74 mg/dL (ref 0.44–1.00)
GFR calc Af Amer: >60 mL/min (ref >60)
GFR calc non Af Amer: >60 mL/min (ref >60)
GLUCOSE: 103 mg/dL — AB (ref 65–99)
POTASSIUM: 4.2 mmol/L (ref 3.5–5.1)
SODIUM: 142 mmol/L (ref 135–145)
Total Bilirubin: 0.2 mg/dL — ABNORMAL LOW (ref 0.3–1.2)
Total Protein: 7.2 g/dL (ref 6.5–8.1)

## 2017-08-04 LAB — CBC
HEMATOCRIT: 42.9 % (ref 36.0–46.0)
HEMOGLOBIN: 13.7 g/dL (ref 12.0–15.0)
MCH: 28.5 pg (ref 26.0–34.0)
MCHC: 31.9 g/dL (ref 30.0–36.0)
MCV: 89.4 fL (ref 78.0–100.0)
Platelets: 285 10*3/uL (ref 150–400)
RBC: 4.8 MIL/uL (ref 3.87–5.11)
RDW: 13.1 % (ref 11.5–15.5)
WBC: 5.4 10*3/uL (ref 4.0–10.5)

## 2017-08-04 LAB — URINALYSIS, ROUTINE W REFLEX MICROSCOPIC
Bilirubin Urine: NEGATIVE
GLUCOSE, UA: NEGATIVE mg/dL
HGB URINE DIPSTICK: NEGATIVE
Ketones, ur: NEGATIVE mg/dL
Leukocytes, UA: NEGATIVE
NITRITE: NEGATIVE
PH: 6 (ref 5.0–8.0)
Protein, ur: NEGATIVE mg/dL
SPECIFIC GRAVITY, URINE: 1.01 (ref 1.005–1.030)

## 2017-08-04 LAB — LIPASE, BLOOD: Lipase: 27 U/L (ref 11–51)

## 2017-08-04 LAB — D-DIMER, QUANTITATIVE: D-Dimer, Quant: 0.31 ug/mL-FEU (ref 0.00–0.50)

## 2017-08-04 MED ORDER — TRAMADOL HCL 50 MG PO TABS: 50.0000 mg | ORAL_TABLET | Freq: Four times a day (QID) | ORAL | 0 refills | Status: DC | PRN

## 2017-08-04 MED ORDER — IOPAMIDOL (ISOVUE-300) INJECTION 61%
100.0000 mL | Freq: Once | INTRAVENOUS | Status: AC | PRN
  Administered 2017-08-04: 100 mL via INTRAVENOUS

## 2017-08-04 MED ORDER — HYDROMORPHONE HCL 1 MG/ML IJ SOLN
1.0000 mg | Freq: Once | INTRAMUSCULAR | Status: AC
  Administered 2017-08-04: 1 mg via INTRAVENOUS
  Filled 2017-08-04: qty 1

## 2017-08-04 MED ORDER — ONDANSETRON HCL 4 MG/2ML IJ SOLN
4.0000 mg | Freq: Once | INTRAMUSCULAR | Status: AC
  Administered 2017-08-04: 4 mg via INTRAVENOUS
  Filled 2017-08-04: qty 2

## 2017-08-04 MED ORDER — PANTOPRAZOLE SODIUM 20 MG PO TBEC: 20.0000 mg | DELAYED_RELEASE_TABLET | Freq: Every day | ORAL | 0 refills | Status: DC

## 2017-08-04 MED ORDER — ONDANSETRON 4 MG PO TBDP: ORAL_TABLET | ORAL | 0 refills | Status: DC

## 2017-08-04 MED ORDER — PANTOPRAZOLE SODIUM 40 MG IV SOLR
40.0000 mg | Freq: Once | INTRAVENOUS | Status: AC
  Administered 2017-08-04: 40 mg via INTRAVENOUS
  Filled 2017-08-04: qty 40

## 2017-08-04 NOTE — Discharge Instructions (Signed)
Follow-up with your family doctor or follow-up with the stomach specialist Dr. Oneida Alar in the next couple weeks

## 2017-08-04 NOTE — ED Provider Notes (Signed)
[facility] EMERGENCY DEPARTMENT Provider Note   CSN: [id number] Arrival date & time: 08/04/17  0149     History   Chief Complaint Chief Complaint  Patient presents with  . Nausea    HPI Ann Ortega is a 72 y.o. female.  Patient complains of right upper quadrant abdominal pain that severe with some nausea   The history is provided by the patient. No language interpreter was used.  Abdominal Pain   This is a new problem. The current episode started 6 to 12 hours ago. The problem occurs constantly. The problem has not changed since onset.The pain is associated with an unknown factor. The pain is located in the epigastric region. The quality of the pain is aching. The pain is at a severity of 6/10. Pertinent negatives include diarrhea, frequency, hematuria and headaches.    Past Medical History:  Diagnosis Date  . Thyroid disease     Patient Active Problem List   Diagnosis Date Noted  . PATELLAR DISLOCATION, LEFT 02/13/2010  . TEAR M C L 01/09/2010  . MEDIAL MENISCUS TEAR, LEFT 01/02/2010    Past Surgical History:  Procedure Laterality Date  . BREAST EXCISIONAL BIOPSY Left     OB History    No data available       Home Medications    Prior to Admission medications   Medication Sig Start Date End Date Taking? Authorizing Provider  levothyroxine (SYNTHROID, LEVOTHROID) 50 MCG tablet  01/18/16  Yes [provider]  cyclobenzaprine (FLEXERIL) 5 MG tablet Take 1 tablet (5 mg total) by mouth at bedtime. Patient not taking: Reported on 02/14/2016 11/25/14   Carole Civil, MD  gabapentin (NEURONTIN) 100 MG capsule  01/11/16   [provider]  HYDROcodone-acetaminophen (NORCO/VICODIN) 5-325 MG tablet Take 1 tablet by mouth every 4 (four) hours as needed for moderate pain (Must last 15 days.Do not take and drive a car or use machinery.). 02/14/16   Sanjuana Kava, MD  methocarbamol (ROBAXIN) 500 MG tablet Take 1 tablet (500 mg total) by mouth 3  (three) times daily. 02/14/16   Sanjuana Kava, MD  ondansetron (ZOFRAN ODT) 4 MG disintegrating tablet 4mg  ODT q4 hours prn nausea/vomit 08/04/17   Milton Ferguson, MD  pantoprazole (PROTONIX) 20 MG tablet Take 1 tablet (20 mg total) by mouth daily. 08/04/17   Milton Ferguson, MD  predniSONE (STERAPRED UNI-PAK 21 TAB) 10 MG (21) TBPK tablet Take six pills the first day;5 pills the next day;4 pills the next day;3 pills the next day; 2 pills the next day,one the final day. Patient not taking: Reported on 02/14/2016 02/01/16   Sanjuana Kava, MD  silver sulfADIAZINE (SILVADENE) 1 % cream Apply 1 application topically daily. Patient not taking: Reported on 02/14/2016 11/14/13   Teressa Lower, MD  traMADol (ULTRAM) 50 MG tablet Take 1 tablet (50 mg total) by mouth every 6 (six) hours as needed. 08/04/17   Milton Ferguson, MD  triamcinolone cream (KENALOG) 0.5 %  12/30/15   [provider]    Family History No family history on file.  Social History Social History   Tobacco Use  . Smoking status: Never Smoker  Substance Use Topics  . Alcohol use: No  . Drug use: No     Allergies   Patient has no known allergies.   Review of Systems Review of Systems  Constitutional: Negative for appetite change and fatigue.  HENT: Negative for congestion, ear discharge and sinus pressure.   Eyes: Negative for discharge.  Respiratory: Negative for cough.   Cardiovascular: Negative for chest pain.  Gastrointestinal: Positive for abdominal pain. Negative for diarrhea.  Genitourinary: Negative for frequency and hematuria.  Musculoskeletal: Negative for back pain.  Skin: Negative for rash.  Neurological: Negative for seizures and headaches.  Psychiatric/Behavioral: Negative for hallucinations.     Physical Exam Updated Vital Signs BP (!) 109/56   Pulse 65   Temp 98.4 F (36.9 C) (Oral)   Resp 18   Ht 5\' 2"  (1.575 m)   Wt 67.1 kg (148 lb)   SpO2 99%   BMI 27.07 kg/m   Physical Exam    Constitutional: She is oriented to person, place, and time. She appears well-developed.  HENT:  Head: Normocephalic.  Eyes: Conjunctivae and EOM are normal. No scleral icterus.  Neck: Neck supple. No thyromegaly present.  Cardiovascular: Normal rate and regular rhythm. Exam reveals no gallop and no friction rub.  No murmur heard. Pulmonary/Chest: No stridor. She has no wheezes. She has no rales. She exhibits no tenderness.  Abdominal: She exhibits no distension. There is tenderness. There is no rebound.  Tender right upper quadrant  Musculoskeletal: Normal range of motion. She exhibits no edema.  Lymphadenopathy:    She has no cervical adenopathy.  Neurological: She is oriented to person, place, and time. She exhibits normal muscle tone. Coordination normal.  Skin: No rash noted. No erythema.  Psychiatric: She has a normal mood and affect. Her behavior is normal.     ED Treatments / Results  Labs (all labs ordered are listed, but only abnormal results are displayed) Labs Reviewed  COMPREHENSIVE METABOLIC PANEL - Abnormal; Notable for the following components:      Result Value   Glucose, Bld 103 (*)    Total Bilirubin 0.2 (*)    All other components within normal limits  URINALYSIS, ROUTINE W REFLEX MICROSCOPIC - Abnormal; Notable for the following components:   Color, Urine STRAW (*)    All other components within normal limits  LIPASE, BLOOD  CBC  D-DIMER, QUANTITATIVE (NOT AT [facility])    EKG  EKG Interpretation None       Radiology US Abdomen Complete  Result Date: 08/04/2017 CLINICAL DATA:  Right back pain x4 days EXAM: ABDOMEN ULTRASOUND COMPLETE COMPARISON:  None. FINDINGS: Gallbladder: No gallstones, gallbladder wall thickening, or pericholecystic fluid. Negative sonographic Murphy's sign. Common bile duct: Diameter: 4 mm Liver: Hyperechoic hepatic parenchyma, suggesting hepatic steatosis. No focal hepatic lesion is seen. Portal vein is patent on color Doppler  imaging with normal direction of blood flow towards the liver. IVC: No abnormality visualized. Pancreas: Visualized portion unremarkable. Spleen: Size and appearance within normal limits. Right Kidney: Length: 10.1 cm. 9 x 5 x 10 mm lower pole cyst. No hydronephrosis. Left Kidney: Length: 2.3 cm.  No mass or hydronephrosis. Abdominal aorta: No aneurysm visualized. Other findings: None. IMPRESSION: Hyperechoic hepatic parenchyma, suggesting hepatic steatosis. 10 mm right lower pole renal cyst.  No hydronephrosis. Electronically Signed   By: Julian Hy M.D.   On: 08/04/2017 08:42   Ct Abdomen Pelvis W Contrast  Result Date: 08/04/2017 CLINICAL DATA:  RIGHT-side abdominal pain, nausea, constipation for 2 days, abdominal distension EXAM: CT ABDOMEN AND PELVIS WITH CONTRAST TECHNIQUE: Multidetector CT imaging of the abdomen and pelvis was performed using the standard protocol following bolus administration of intravenous contrast. Sagittal and coronal MPR images reconstructed from axial data set. CONTRAST:  125mL ISOVUE-300 IOPAMIDOL (ISOVUE-300) INJECTION 61% IV. No oral contrast. COMPARISON:  None FINDINGS: Lower  chest: Dependent bibasilar atelectasis Hepatobiliary: Diffuse fatty infiltration of liver with focal sparing adjacent to gallbladder fossa. Gallbladder and liver otherwise normal appearance. No biliary dilatation. Pancreas: Normal appearance Spleen: Normal appearance Adrenals/Urinary Tract: Adrenal glands, kidneys, ureters and bladder normal appearance Stomach/Bowel: Normal appendix. Scattered stool throughout colon. Stomach and bowel loops otherwise normal appearance. Vascular/Lymphatic: Atherosclerotic calcifications aorta without aneurysm. No adenopathy. Reproductive: Probable upper LEFT uterine leiomyoma 4.0 x 3.8 cm image 65. Uterus and adnexa otherwise normal appearance. Other: No free air or free fluid. No hernia or acute inflammatory process. Musculoskeletal: Normal appearance IMPRESSION:  Diffuse fatty infiltration of liver with focal sparing adjacent to gallbladder fossa. Probable 4.0 cm diameter uterine leiomyoma. Otherwise normal exam. Electronically Signed   By: Lavonia Dana M.D.   On: 08/04/2017 11:11    Procedures Procedures (including critical care time)  Medications Ordered in ED Medications  HYDROmorphone (DILAUDID) injection 1 mg (1 mg Intravenous Given 08/04/17 0744)  ondansetron (ZOFRAN) injection 4 mg (4 mg Intravenous Given 08/04/17 0744)  pantoprazole (PROTONIX) injection 40 mg (40 mg Intravenous Given 08/04/17 0744)  iopamidol (ISOVUE-300) 61 % injection 100 mL (100 mLs Intravenous Contrast Given 08/04/17 1035)     Initial Impression / Assessment and Plan / ED Course  I have reviewed the triage vital signs and the nursing notes.  Pertinent labs & imaging results that were available during my care of the patient were reviewed by me and considered in my medical decision making (see chart for details).   Patient with abdominal pain.  She had ultrasound of her abdomen and CT of the abdomen which were unremarkable.  She will placed on Protonix Ultram and Zofran and will follow up with her primary care doctor and she is also been referred to GI    Final Clinical Impressions(s) / ED Diagnoses   Final diagnoses:  Acute superficial gastritis without hemorrhage    ED Discharge Orders        Ordered    pantoprazole (PROTONIX) 20 MG tablet  Daily     08/04/17 1147    traMADol (ULTRAM) 50 MG tablet  Every 6 hours PRN     08/04/17 1147    ondansetron (ZOFRAN ODT) 4 MG disintegrating tablet     08/04/17 1147       Milton Ferguson, MD 08/04/17 1155

## 2017-08-04 NOTE — ED Notes (Signed)
Pt in CT.

## 2017-08-04 NOTE — ED Triage Notes (Signed)
Pt reports onset of nausea last night. C/o pain in the right upper abdomen, radiates around to her back, tender to palpation and worse to take a deep breath. Denies cough or fever.

## 2017-08-04 NOTE — ED Notes (Signed)
From CT 

## 2017-08-04 NOTE — ED Notes (Signed)
Pt reports feeling better

## 2017-08-12 ENCOUNTER — Encounter: Payer: Self-pay | Admitting: Gastroenterology

## 2017-08-12 DIAGNOSIS — K219 Gastro-esophageal reflux disease without esophagitis: Secondary | ICD-10-CM | POA: Diagnosis not present

## 2017-08-12 DIAGNOSIS — R140 Abdominal distension (gaseous): Secondary | ICD-10-CM | POA: Diagnosis not present

## 2017-08-12 DIAGNOSIS — K5900 Constipation, unspecified: Secondary | ICD-10-CM | POA: Diagnosis not present

## 2017-08-28 ENCOUNTER — Emergency Department (HOSPITAL_COMMUNITY): Payer: Medicare Other

## 2017-08-28 ENCOUNTER — Encounter (HOSPITAL_COMMUNITY): Payer: Self-pay

## 2017-08-28 ENCOUNTER — Emergency Department (HOSPITAL_COMMUNITY)
Admission: EM | Admit: 2017-08-28 | Discharge: 2017-08-28 | Disposition: A | Discharge status: Home / Self Care | Payer: Medicare Other | Attending: Emergency Medicine | Admitting: Emergency Medicine

## 2017-08-28 DIAGNOSIS — Z79899 Other long term (current) drug therapy: Secondary | ICD-10-CM | POA: Insufficient documentation

## 2017-08-28 DIAGNOSIS — D259 Leiomyoma of uterus, unspecified: Secondary | ICD-10-CM | POA: Diagnosis not present

## 2017-08-28 DIAGNOSIS — R102 Pelvic and perineal pain: Secondary | ICD-10-CM | POA: Diagnosis not present

## 2017-08-28 DIAGNOSIS — R8290 Unspecified abnormal findings in urine: Secondary | ICD-10-CM | POA: Diagnosis not present

## 2017-08-28 DIAGNOSIS — R1032 Left lower quadrant pain: Secondary | ICD-10-CM | POA: Diagnosis not present

## 2017-08-28 DIAGNOSIS — E063 Autoimmune thyroiditis: Secondary | ICD-10-CM | POA: Diagnosis not present

## 2017-08-28 DIAGNOSIS — E663 Overweight: Secondary | ICD-10-CM | POA: Diagnosis not present

## 2017-08-28 DIAGNOSIS — Z6828 Body mass index (BMI) 28.0-28.9, adult: Secondary | ICD-10-CM | POA: Diagnosis not present

## 2017-08-28 DIAGNOSIS — Z1389 Encounter for screening for other disorder: Secondary | ICD-10-CM | POA: Diagnosis not present

## 2017-08-28 DIAGNOSIS — R109 Unspecified abdominal pain: Secondary | ICD-10-CM

## 2017-08-28 DIAGNOSIS — D250 Submucous leiomyoma of uterus: Secondary | ICD-10-CM | POA: Diagnosis not present

## 2017-08-28 DIAGNOSIS — R1031 Right lower quadrant pain: Secondary | ICD-10-CM | POA: Diagnosis not present

## 2017-08-28 LAB — CBC WITH DIFFERENTIAL/PLATELET
Basophils Absolute: 0 10*3/uL (ref 0.0–0.1)
Basophils Relative: 1 %
Eosinophils Absolute: 0.2 10*3/uL (ref 0.0–0.7)
Eosinophils Relative: 3 %
HEMATOCRIT: 41.8 % (ref 36.0–46.0)
HEMOGLOBIN: 13.2 g/dL (ref 12.0–15.0)
LYMPHS ABS: 2.7 10*3/uL (ref 0.7–4.0)
LYMPHS PCT: 47 %
MCH: 28.2 pg (ref 26.0–34.0)
MCHC: 31.6 g/dL (ref 30.0–36.0)
MCV: 89.3 fL (ref 78.0–100.0)
Monocytes Absolute: 0.7 10*3/uL (ref 0.1–1.0)
Monocytes Relative: 12 %
NEUTROS ABS: 2.1 10*3/uL (ref 1.7–7.7)
NEUTROS PCT: 37 %
Platelets: 295 10*3/uL (ref 150–400)
RBC: 4.68 MIL/uL (ref 3.87–5.11)
RDW: 13.2 % (ref 11.5–15.5)
WBC: 5.7 10*3/uL (ref 4.0–10.5)

## 2017-08-28 LAB — COMPREHENSIVE METABOLIC PANEL
ALK PHOS: 69 U/L (ref 38–126)
ALT: 24 U/L (ref 14–54)
AST: 25 U/L (ref 15–41)
Albumin: 4 g/dL (ref 3.5–5.0)
Anion gap: 9 (ref 5–15)
BUN: 10 mg/dL (ref 6–20)
CALCIUM: 9.5 mg/dL (ref 8.9–10.3)
CO2: 24 mmol/L (ref 22–32)
CREATININE: 0.69 mg/dL (ref 0.44–1.00)
Chloride: 106 mmol/L (ref 101–111)
GFR calc non Af Amer: >60 mL/min (ref >60)
GLUCOSE: 84 mg/dL (ref 65–99)
Potassium: 4 mmol/L (ref 3.5–5.1)
SODIUM: 139 mmol/L (ref 135–145)
Total Bilirubin: 0.6 mg/dL (ref 0.3–1.2)
Total Protein: 7.1 g/dL (ref 6.5–8.1)

## 2017-08-28 LAB — URINALYSIS, ROUTINE W REFLEX MICROSCOPIC
BILIRUBIN URINE: NEGATIVE
GLUCOSE, UA: NEGATIVE mg/dL
Hgb urine dipstick: NEGATIVE
KETONES UR: NEGATIVE mg/dL
Leukocytes, UA: NEGATIVE
NITRITE: NEGATIVE
PH: 6 (ref 5.0–8.0)
Protein, ur: NEGATIVE mg/dL
Specific Gravity, Urine: 1.015 (ref 1.005–1.030)

## 2017-08-28 LAB — LIPASE, BLOOD: Lipase: 24 U/L (ref 11–51)

## 2017-08-28 MED ORDER — ACETAMINOPHEN 325 MG PO TABS
650.0000 mg | ORAL_TABLET | Freq: Once | ORAL | Status: AC
  Administered 2017-08-28: 650 mg via ORAL
  Filled 2017-08-28: qty 2

## 2017-08-28 NOTE — ED Notes (Signed)
Patient still in ultrasound.

## 2017-08-28 NOTE — ED Notes (Signed)
Patient in ultrasound.

## 2017-08-28 NOTE — ED Triage Notes (Signed)
Pt c/o pain in RLQ since this morning.  Denies urinary symptoms.  Denies any abnormal vaginal bleeding or discharge.  LBM was last night and was normal per pt.

## 2017-08-28 NOTE — Discharge Instructions (Signed)
Take over the counter tylenol, as directed on packaging, as needed for discomfort.  Apply moist heat to the area(s) of discomfort, for 15 minutes at a time, several times per day for the next few days.  Do not fall asleep on a heating pack.  Call your regular medical doctor tomorrow to schedule a follow up appointment this week.  Return to the Emergency Department immediately if worsening.

## 2017-08-28 NOTE — ED Provider Notes (Signed)
[facility] EMERGENCY DEPARTMENT Provider Note   CSN: [id number] Arrival date & time: 08/28/17  1620     History   Chief Complaint Chief Complaint  Patient presents with  . Abdominal Pain    HPI Ann Ortega is a 72 y.o. female.  HPI  Pt was seen at 1630.  Per pt, c/o sudden onset and persistence of constant RLQ abd "pain" since this morning.  Has been associated with no other symptoms. Describes the abd pain as "sharp."  Denies N/V, no diarrhea, no fevers, no back pain, no rash, no CP/SOB, no black or blood in stools, no dysuria/hematuria, no vaginal bleeding/discharge.      Past Medical History:  Diagnosis Date  . Thyroid disease     Patient Active Problem List   Diagnosis Date Noted  . PATELLAR DISLOCATION, LEFT 02/13/2010  . TEAR M C L 01/09/2010  . MEDIAL MENISCUS TEAR, LEFT 01/02/2010    Past Surgical History:  Procedure Laterality Date  . BREAST EXCISIONAL BIOPSY Left     OB History    No data available       Home Medications    Prior to Admission medications   Medication Sig Start Date End Date Taking? Authorizing Provider  cyclobenzaprine (FLEXERIL) 5 MG tablet Take 1 tablet (5 mg total) by mouth at bedtime. Patient not taking: Reported on 02/14/2016 11/25/14   Carole Civil, MD  gabapentin (NEURONTIN) 100 MG capsule  01/11/16   [provider]  HYDROcodone-acetaminophen (NORCO/VICODIN) 5-325 MG tablet Take 1 tablet by mouth every 4 (four) hours as needed for moderate pain (Must last 15 days.Do not take and drive a car or use machinery.). 02/14/16   Sanjuana Kava, MD  levothyroxine (SYNTHROID, LEVOTHROID) 50 MCG tablet  01/18/16   [provider]  methocarbamol (ROBAXIN) 500 MG tablet Take 1 tablet (500 mg total) by mouth 3 (three) times daily. 02/14/16   Sanjuana Kava, MD  ondansetron (ZOFRAN ODT) 4 MG disintegrating tablet 4mg  ODT q4 hours prn nausea/vomit 08/04/17   Milton Ferguson, MD  pantoprazole (PROTONIX) 20 MG tablet Take 1  tablet (20 mg total) by mouth daily. 08/04/17   Milton Ferguson, MD  predniSONE (STERAPRED UNI-PAK 21 TAB) 10 MG (21) TBPK tablet Take six pills the first day;5 pills the next day;4 pills the next day;3 pills the next day; 2 pills the next day,one the final day. Patient not taking: Reported on 02/14/2016 02/01/16   Sanjuana Kava, MD  silver sulfADIAZINE (SILVADENE) 1 % cream Apply 1 application topically daily. Patient not taking: Reported on 02/14/2016 11/14/13   Teressa Lower, MD  traMADol (ULTRAM) 50 MG tablet Take 1 tablet (50 mg total) by mouth every 6 (six) hours as needed. 08/04/17   Milton Ferguson, MD  triamcinolone cream (KENALOG) 0.5 %  12/30/15   [provider]    Family History No family history on file.  Social History Social History   Tobacco Use  . Smoking status: Never Smoker  . Smokeless tobacco: Never Used  Substance Use Topics  . Alcohol use: Yes    Comment: occ  . Drug use: No     Allergies   Patient has no known allergies.   Review of Systems Review of Systems ROS: Statement: All systems negative except as marked or noted in the HPI; Constitutional: Negative for fever and chills. ; ; Eyes: Negative for eye pain, redness and discharge. ; ; ENMT: Negative for ear pain, hoarseness, nasal congestion, sinus pressure and sore throat. ; ;  Cardiovascular: Negative for chest pain, palpitations, diaphoresis, dyspnea and peripheral edema. ; ; Respiratory: Negative for cough, wheezing and stridor. ; ; Gastrointestinal: +abd pain. Negative for nausea, vomiting, diarrhea, blood in stool, hematemesis, jaundice and rectal bleeding. . ; ; Genitourinary: Negative for dysuria, flank pain and hematuria. ; ; GYN:  No vaginal bleeding, no vaginal discharge, no vulvar pain. ;; Musculoskeletal: Negative for back pain and neck pain. Negative for swelling and trauma.; ; Skin: Negative for pruritus, rash, abrasions, blisters, bruising and skin lesion.; ; Neuro: Negative for headache,  lightheadedness and neck stiffness. Negative for weakness, altered level of consciousness, altered mental status, extremity weakness, paresthesias, involuntary movement, seizure and syncope.       Physical Exam Updated Vital Signs BP (!) 153/75 (BP Location: Right Arm)   Pulse 75   Temp (!) 97.4 F (36.3 C) (Oral)   Resp 16   Ht 5\' 2"  (1.575 m)   Wt 68.9 kg (152 lb)   SpO2 100%   BMI 27.80 kg/m   Physical Exam 1635: Physical examination:  Nursing notes reviewed; Vital signs and O2 SAT reviewed;  Constitutional: Well developed, Well nourished, Well hydrated, In no acute distress; Head:  Normocephalic, atraumatic; Eyes: EOMI, PERRL, No scleral icterus; ENMT: Mouth and pharynx normal, Mucous membranes moist; Neck: Supple, Full range of motion, No lymphadenopathy; Cardiovascular: Regular rate and rhythm, No gallop; Respiratory: Breath sounds clear & equal bilaterally, No wheezes.  Speaking full sentences with ease, Normal respiratory effort/excursion; Chest: Nontender, Movement normal; Abdomen: Soft, +RLQ tenderness to palp. No rebound or guarding. Nondistended, Normal bowel sounds; Genitourinary: No CVA tenderness; Extremities: Pulses normal, No tenderness, No edema, No calf edema or asymmetry.; Neuro: AA&Ox3, Major CN grossly intact.  Speech clear. No gross focal motor or sensory deficits in extremities. Climbs on and off stretcher easily by herself. Gait steady.; Skin: Color normal, Warm, Dry.    ED Treatments / Results  Labs (all labs ordered are listed, but only abnormal results are displayed)   EKG  EKG Interpretation None       Radiology   Procedures Procedures (including critical care time)  Medications Ordered in ED Medications - No data to display   Initial Impression / Assessment and Plan / ED Course  I have reviewed the triage vital signs and the nursing notes.  Pertinent labs & imaging results that were available during my care of the patient were reviewed by  me and considered in my medical decision making (see chart for details).  MDM Reviewed: previous chart, nursing note and vitals Reviewed previous: labs Interpretation: labs, CT scan and ultrasound   Results for orders placed or performed during the hospital encounter of 08/28/17  CBC with Differential  Result Value Ref Range   WBC 5.7 4.0 - 10.5 K/uL   RBC 4.68 3.87 - 5.11 MIL/uL   Hemoglobin 13.2 12.0 - 15.0 g/dL   HCT 41.8 36.0 - 46.0 %   MCV 89.3 78.0 - 100.0 fL   MCH 28.2 26.0 - 34.0 pg   MCHC 31.6 30.0 - 36.0 g/dL   RDW 13.2 11.5 - 15.5 %   Platelets 295 150 - 400 K/uL   Neutrophils Relative % 37 %   Neutro Abs 2.1 1.7 - 7.7 K/uL   Lymphocytes Relative 47 %   Lymphs Abs 2.7 0.7 - 4.0 K/uL   Monocytes Relative 12 %   Monocytes Absolute 0.7 0.1 - 1.0 K/uL   Eosinophils Relative 3 %   Eosinophils Absolute 0.2 0.0 -  0.7 K/uL   Basophils Relative 1 %   Basophils Absolute 0.0 0.0 - 0.1 K/uL  Comprehensive metabolic panel  Result Value Ref Range   Sodium 139 135 - 145 mmol/L   Potassium 4.0 3.5 - 5.1 mmol/L   Chloride 106 101 - 111 mmol/L   CO2 24 22 - 32 mmol/L   Glucose, Bld 84 65 - 99 mg/dL   BUN 10 6 - 20 mg/dL   Creatinine, Ser 0.69 0.44 - 1.00 mg/dL   Calcium 9.5 8.9 - 10.3 mg/dL   Total Protein 7.1 6.5 - 8.1 g/dL   Albumin 4.0 3.5 - 5.0 g/dL   AST 25 15 - 41 U/L   ALT 24 14 - 54 U/L   Alkaline Phosphatase 69 38 - 126 U/L   Total Bilirubin 0.6 0.3 - 1.2 mg/dL   GFR calc non Af Amer >60 >60 mL/min   GFR calc Af Amer >60 >60 mL/min   Anion gap 9 5 - 15  Urinalysis, Routine w reflex microscopic  Result Value Ref Range   Color, Urine YELLOW YELLOW   APPearance CLEAR CLEAR   Specific Gravity, Urine 1.015 1.005 - 1.030   pH 6.0 5.0 - 8.0   Glucose, UA NEGATIVE NEGATIVE mg/dL   Hgb urine dipstick NEGATIVE NEGATIVE   Bilirubin Urine NEGATIVE NEGATIVE   Ketones, ur NEGATIVE NEGATIVE mg/dL   Protein, ur NEGATIVE NEGATIVE mg/dL   Nitrite NEGATIVE NEGATIVE    Leukocytes, UA NEGATIVE NEGATIVE  Lipase, blood  Result Value Ref Range   Lipase 24 11 - 51 U/L   US Pelvis Complete Result Date: 08/28/2017 CLINICAL DATA:  Initial evaluation for acute right-sided pelvic pain for 1 day. EXAM: TRANSABDOMINAL AND TRANSVAGINAL ULTRASOUND OF PELVIS DOPPLER ULTRASOUND OF OVARIES TECHNIQUE: Both transabdominal and transvaginal ultrasound examinations of the pelvis were performed. Transabdominal technique was performed for global imaging of the pelvis including uterus, ovaries, adnexal regions, and pelvic cul-de-sac. It was necessary to proceed with endovaginal exam following the transabdominal exam to visualize the uterus and ovaries. Color and duplex Doppler ultrasound was utilized to evaluate blood flow to the ovaries. COMPARISON:  Prior CT from 08/04/2017. FINDINGS: Uterus Measurements: 7.7 x 5.3 x 6.8 cm. Three total uterine fibroids present. 2.1 x 1.8 x 1.6 cm submucosal fibroid at the uterine fundus. 4.1 x 2.7 x 2.6 cm submucosal fibroid also at the uterine fundus. 1.7 x 1.4 x 1.7 cm intramural/sub mucosal fibroid present at the posterior uterine fundus. Endometrium Thickness: 3 mm.  No focal abnormality visualized. Right ovary Measurements: 2.5 x 1.4 x 1.4 cm. Normal appearance/no adnexal mass. Left ovary Not visualized.  No adnexal mass. Pulsed Doppler evaluation of the right ovary demonstrates normal low-resistance arterial and venous waveforms. Other findings No abnormal free fluid. IMPRESSION: 1. No acute abnormality within the pelvis. Normal right ovary without evidence for ovarian torsion. No adnexal mass. 2. Nonvisualization of the left ovary. 3. Fibroid uterus as above. Electronically Signed   By: Jeannine Boga M.D.   On: 08/28/2017 17:30    Ct Renal Stone Study Result Date: 08/28/2017 CLINICAL DATA:  Right lower quadrant pain for several hours EXAM: CT ABDOMEN AND PELVIS WITHOUT CONTRAST TECHNIQUE: Multidetector CT imaging of the abdomen and pelvis was  performed following the standard protocol without IV contrast. COMPARISON:  Ultrasound from earlier in the same day. FINDINGS: Lower chest: No acute abnormality. Hepatobiliary: Diffuse fatty infiltration of the liver is noted. The gallbladder is within normal limits. Pancreas: Unremarkable. No pancreatic ductal dilatation  or surrounding inflammatory changes. Spleen: Normal in size without focal abnormality. Adrenals/Urinary Tract: The adrenal glands are within normal limits. Kidneys are well visualized bilaterally. Bladder is decompressed. No renal calculi or obstructive changes are noted. Stomach/Bowel: The appendix is within normal limits. No obstructive or inflammatory changes of the bowel are noted. Vascular/Lymphatic: Aortic atherosclerosis. No enlarged abdominal or pelvic lymph nodes. Reproductive: Uterus is prominent consistent with a fibroid change seen on recent ultrasound. Other: No abdominal wall hernia or abnormality. No abdominopelvic ascites. Musculoskeletal: Degenerative changes of lumbar spine are noted. IMPRESSION: Fatty infiltration of the liver. Changes of fibroid uterus. No acute abnormality is noted. Electronically Signed   By: Inez Catalina M.D.   On: 08/28/2017 17:38    2000:  Workup reassuring. Pt defers pelvic exam at this time. Tx symptomatically. Dx and testing d/w pt and family.  Questions answered.  Verb understanding, agreeable to d/c home with outpt f/u.   Final Clinical Impressions(s) / ED Diagnoses   Final diagnoses:  Pelvic pain  Pelvic pain    ED Discharge Orders    None       Francine Graven, DO 08/31/17 1112

## 2017-08-30 LAB — URINE CULTURE: Culture: <10000 — AB

## 2017-09-24 ENCOUNTER — Ambulatory Visit: Admitting: Nurse Practitioner

## 2017-11-05 DIAGNOSIS — Z6828 Body mass index (BMI) 28.0-28.9, adult: Secondary | ICD-10-CM | POA: Diagnosis not present

## 2017-11-05 DIAGNOSIS — E663 Overweight: Secondary | ICD-10-CM | POA: Diagnosis not present

## 2017-11-05 DIAGNOSIS — M533 Sacrococcygeal disorders, not elsewhere classified: Secondary | ICD-10-CM | POA: Diagnosis not present

## 2017-11-05 DIAGNOSIS — M5416 Radiculopathy, lumbar region: Secondary | ICD-10-CM | POA: Diagnosis not present

## 2018-02-10 DIAGNOSIS — K219 Gastro-esophageal reflux disease without esophagitis: Secondary | ICD-10-CM | POA: Diagnosis not present

## 2018-02-19 DIAGNOSIS — Z0001 Encounter for general adult medical examination with abnormal findings: Secondary | ICD-10-CM | POA: Diagnosis not present

## 2018-02-19 DIAGNOSIS — Z6827 Body mass index (BMI) 27.0-27.9, adult: Secondary | ICD-10-CM | POA: Diagnosis not present

## 2018-02-19 DIAGNOSIS — R946 Abnormal results of thyroid function studies: Secondary | ICD-10-CM | POA: Diagnosis not present

## 2018-02-19 DIAGNOSIS — E559 Vitamin D deficiency, unspecified: Secondary | ICD-10-CM | POA: Diagnosis not present

## 2018-02-19 DIAGNOSIS — E039 Hypothyroidism, unspecified: Secondary | ICD-10-CM | POA: Diagnosis not present

## 2018-02-19 DIAGNOSIS — Z1389 Encounter for screening for other disorder: Secondary | ICD-10-CM | POA: Diagnosis not present

## 2018-02-19 DIAGNOSIS — E663 Overweight: Secondary | ICD-10-CM | POA: Diagnosis not present

## 2018-02-19 DIAGNOSIS — E782 Mixed hyperlipidemia: Secondary | ICD-10-CM | POA: Diagnosis not present

## 2018-03-18 DIAGNOSIS — M5416 Radiculopathy, lumbar region: Secondary | ICD-10-CM | POA: Diagnosis not present

## 2018-04-17 DIAGNOSIS — K219 Gastro-esophageal reflux disease without esophagitis: Secondary | ICD-10-CM | POA: Diagnosis not present

## 2018-04-17 DIAGNOSIS — R12 Heartburn: Secondary | ICD-10-CM | POA: Diagnosis not present

## 2018-04-23 DIAGNOSIS — K219 Gastro-esophageal reflux disease without esophagitis: Secondary | ICD-10-CM | POA: Diagnosis not present

## 2018-04-26 DIAGNOSIS — Z23 Encounter for immunization: Secondary | ICD-10-CM | POA: Diagnosis not present

## 2018-05-20 DIAGNOSIS — M67811 Other specified disorders of synovium, right shoulder: Secondary | ICD-10-CM | POA: Diagnosis not present

## 2018-05-20 DIAGNOSIS — Z6827 Body mass index (BMI) 27.0-27.9, adult: Secondary | ICD-10-CM | POA: Diagnosis not present

## 2018-05-20 DIAGNOSIS — E663 Overweight: Secondary | ICD-10-CM | POA: Diagnosis not present

## 2018-06-02 DIAGNOSIS — Z6827 Body mass index (BMI) 27.0-27.9, adult: Secondary | ICD-10-CM | POA: Diagnosis not present

## 2018-06-02 DIAGNOSIS — Z1389 Encounter for screening for other disorder: Secondary | ICD-10-CM | POA: Diagnosis not present

## 2018-06-02 DIAGNOSIS — M65811 Other synovitis and tenosynovitis, right shoulder: Secondary | ICD-10-CM | POA: Diagnosis not present

## 2018-06-02 DIAGNOSIS — E663 Overweight: Secondary | ICD-10-CM | POA: Diagnosis not present

## 2018-06-03 ENCOUNTER — Other Ambulatory Visit: Payer: Self-pay | Admitting: Physician Assistant

## 2018-06-03 DIAGNOSIS — Z1231 Encounter for screening mammogram for malignant neoplasm of breast: Secondary | ICD-10-CM

## 2018-06-23 ENCOUNTER — Ambulatory Visit (INDEPENDENT_AMBULATORY_CARE_PROVIDER_SITE_OTHER): Payer: Medicare Other

## 2018-06-23 ENCOUNTER — Encounter: Payer: Self-pay | Admitting: Orthopedic Surgery

## 2018-06-23 ENCOUNTER — Ambulatory Visit (INDEPENDENT_AMBULATORY_CARE_PROVIDER_SITE_OTHER): Payer: Medicare Other | Admitting: Orthopedic Surgery

## 2018-06-23 VITALS — BP 140/79 | HR 76 | Ht 61.0 in | Wt 147.0 lb

## 2018-06-23 DIAGNOSIS — M542 Cervicalgia: Secondary | ICD-10-CM | POA: Diagnosis not present

## 2018-06-23 DIAGNOSIS — M25511 Pain in right shoulder: Secondary | ICD-10-CM

## 2018-06-23 NOTE — Progress Notes (Signed)
Chief Complaint  Patient presents with  . Shoulder Pain    Right shoulder. No known injury but was recently doing work in her garage    72 year old female presents for evaluation of 1 month history of right shoulder pain she is status post right rotator cuff repair in 2004 with a mini open repair.  1 month ago she was moving some boxes in her attic and since that time she is noted increasing pain right shoulder medial scapula unrelieved by 2 weeks of prednisone  The pain radiates up into the neck she cannot raise her arm she has pain when she is raising her arm and she reports weakness when trying to get things out of a cupboard or lifting things away from her body  She does not complain of any numbness or tingling in the right hand  Review of Systems  Constitutional: Negative for chills, fever, malaise/fatigue and weight loss.  Gastrointestinal: Positive for heartburn.  Musculoskeletal: Positive for neck pain.  Neurological: Negative for tingling.   Past Medical History:  Diagnosis Date  . Thyroid disease     Past Surgical History:  Procedure Laterality Date  . BREAST EXCISIONAL BIOPSY Left     No family history on file.   Social History   Tobacco Use  . Smoking status: Never Smoker  . Smokeless tobacco: Never Used  Substance Use Topics  . Alcohol use: Yes    Comment: occ  . Drug use: No    BP 140/79   Pulse 76   Ht 5\' 1"  (1.549 m)   Wt 147 lb (66.7 kg)   BMI 27.78 kg/m   Physical Exam  Constitutional: She is oriented to person, place, and time. She appears well-developed and well-nourished.  Neurological: She is alert and oriented to person, place, and time. Gait normal.  Psychiatric: She has a normal mood and affect. Judgment normal.  Vitals reviewed.   Left shoulder reveals no tenderness and she has full range of motion in that arm.  Her strength is normal the skin is intact she has a good pulse no lymphadenopathy normal sensation  Right shoulder active  range of motion 120 degrees with weakness in the supraspinatus with empty can sign she is tender in the medial scapula nontender in the cervical spine in abduction external rotation she is stable skin is normal pulses are good her previous skin incision from mini open repair looks normal with some widening and scarring lymph nodes are negative sensation is normal   X-rays RIGHT shoulder show previous rotator cuff anchor in good position mild inferior glenohumeral osteophyte with no joint space narrowing  C-spine shows multilevel: Cervical spondylosis with anterolisthesis of C7 on T1   Encounter Diagnoses  Name Primary?  . Pain in joint of right shoulder Yes  . Neck pain    Recommend subacromial injection for diagnostic and therapeutic purposes  Start physical therapy  Return in 6 weeks  Differential diagnosis includes bursitis impingement and rotator cuff week current tear cervical spondylosis with arm pain   Procedure note the subacromial injection shoulder RIGHT  Verbal consent was obtained to inject the  RIGHT   Shoulder  Timeout was completed to confirm the injection site is a subacromial space of the  RIGHT  shoulder   Medication used Depo-Medrol 40 mg and lidocaine 1% 3 cc  Anesthesia was provided by ethyl chloride  The injection was performed in the RIGHT  posterior subacromial space. After pinning the skin with alcohol and anesthetized the skin  with ethyl chloride the subacromial space was injected using a 20-gauge needle. There were no complications  Sterile dressing was applied.

## 2018-06-24 ENCOUNTER — Other Ambulatory Visit: Payer: Self-pay

## 2018-06-24 ENCOUNTER — Encounter (HOSPITAL_COMMUNITY): Payer: Self-pay | Admitting: Occupational Therapy

## 2018-06-24 ENCOUNTER — Ambulatory Visit (HOSPITAL_COMMUNITY): Payer: Medicare Other | Attending: Orthopedic Surgery | Admitting: Occupational Therapy

## 2018-06-24 DIAGNOSIS — M25511 Pain in right shoulder: Secondary | ICD-10-CM | POA: Insufficient documentation

## 2018-06-24 DIAGNOSIS — R29898 Other symptoms and signs involving the musculoskeletal system: Secondary | ICD-10-CM

## 2018-06-24 NOTE — Therapy (Signed)
[place], [place], [address] Phone: [phone]   Fax:  [phone]  Occupational Therapy Evaluation  Patient Details  Name: Ann Ortega MRN: [id number] Date of Birth: [birth year] Referring Provider (OT): Dr. Arther Abbott   Encounter Date: 06/24/2018  OT End of Session - 06/24/18 1252    Visit Number  1    Number of Visits  8    Date for OT Re-Evaluation  07/24/18    Authorization Type  Medicare Part A & B; [employer]    OT Start Time  1118    OT Stop Time  1155    OT Time Calculation (min)  37 min    Activity Tolerance  Patient tolerated treatment well    Behavior During Therapy  [facility] for tasks assessed/performed       Past Medical History:  Diagnosis Date  . Thyroid disease     Past Surgical History:  Procedure Laterality Date  . BREAST EXCISIONAL BIOPSY Left     There were no vitals filed for this visit.  Subjective Assessment - 06/24/18 1249    Subjective   S: I think it began hurting around one month ago.     Pertinent History  Pt is a 72 y/o female presenting with right shoulder pain present for approximately 1 month. Pt received cortisone shot yesterday which seems to have decreased her pain somewhat. Pt was referred to occupational therapy for evaluation and treatment by Dr. Arther Abbott.     Special Tests  FOTO: 53/100    Patient Stated Goals  To have less pain and be able to use my arm.     Currently in Pain?  Yes    Pain Score  3     Pain Location  Shoulder    Pain Orientation  Right    Pain Descriptors / Indicators  Aching;Sore    Pain Type  Acute pain    Pain Radiating Towards  neck    Pain Onset  1 to 4 weeks ago    Pain Frequency  Intermittent    Aggravating Factors   movement, use    Pain Relieving Factors  ice, pain medication    Effect of Pain on Daily Activities  min/mod effect on ADL completion    Multiple Pain Sites  No        [facility] OT Assessment - 06/24/18 1120       Assessment   Medical Diagnosis  right shoulder pain    Referring Provider (OT)  Dr. Arther Abbott    Onset Date/Surgical Date  05/24/18    Hand Dominance  Right    Next MD Visit  08/04/2018    Prior Therapy  None      Precautions   Precautions  None      Restrictions   Weight Bearing Restrictions  No      Balance Screen   Has the patient fallen in the past 6 months  No    Has the patient had a decrease in activity level because of a fear of falling?   No    Is the patient reluctant to leave their home because of a fear of falling?   No      Prior Function   Level of Independence  Independent    Vocation  Retired    Education officer, museum, listening to music      ADL   ADL comments  Pt is having difficulty with  dressing tasks, lifting pots/pans/dishes, reaching overhead, fixing hair, and sleeping      Written Expression   Dominant Hand  Right      Cognition   Overall Cognitive Status  Within Functional Limits for tasks assessed      Observation/Other Assessments   Focus on Therapeutic Outcomes (FOTO)   53/100      ROM / Strength   AROM / PROM / Strength  AROM;PROM;Strength      Palpation   Palpation comment  mod fascial restrictions in upper arm, trapezius, and scapularis regions      AROM   Overall AROM Comments  Assessed seated, er/IR adducted    AROM Assessment Site  Shoulder    Right/Left Shoulder  Right    Right Shoulder Flexion  140 Degrees    Right Shoulder ABduction  146 Degrees    Right Shoulder Internal Rotation  90 Degrees    Right Shoulder External Rotation  34 Degrees      PROM   Overall PROM Comments  Assessed supine, er/IR adducted    PROM Assessment Site  Shoulder    Right/Left Shoulder  Right    Right Shoulder Flexion  170 Degrees    Right Shoulder ABduction  180 Degrees    Right Shoulder Internal Rotation  90 Degrees    Right Shoulder External Rotation  40 Degrees      Strength   Overall Strength Comments  Assessed seated, er/IR  adducted    Strength Assessment Site  Shoulder    Right/Left Shoulder  Right    Right Shoulder Flexion  3/5    Right Shoulder ABduction  3/5    Right Shoulder Internal Rotation  4/5    Right Shoulder External Rotation  3/5                      OT Education - 06/24/18 1144    Education Details  shoulder stretches    Person(s) Educated  Patient    Methods  Explanation;Demonstration;Handout    Comprehension  Verbalized understanding;Returned demonstration       OT Short Term Goals - 06/24/18 1255      OT SHORT TERM GOAL #1   Title  Pt will be provided with and educated on HEP to improve ability to use RUE as dominant during ADLs.     Time  4    Period  Weeks    Status  New    Target Date  08/23/18      OT SHORT TERM GOAL #2   Title  Pt will decrease pain in RUE to 3/10 or less to improve ability to sleep.     Time  4    Period  Weeks    Status  New      OT SHORT TERM GOAL #3   Title  Pt will improve A/ROM to WNL in RUE to improve ability to reach overhead during ADL completion.    Time  4    Period  Weeks    Status  New      OT SHORT TERM GOAL #4   Title  Pt will decrease RUE fascial restrictions to minimal amounts or less to improve mobility required for functional reaching tasks.     Time  4    Period  Weeks    Status  New      OT SHORT TERM GOAL #5   Title  Pt will increase RUE strength to 4+/5 or greater to  improve ability to lift pot/pans during cooking tasks.     Time  4    Period  Weeks    Status  New               Plan - 06/24/18 1252    Clinical Impression Statement  A: Pt is a 71 y/o female presenting with right shoulder pain limiting ability to complete ADL tasks using RUE as dominant. Pt with tenderness around medium muscle knot palpated at upper trapezius. Pt provided with shoulder stretches for HEP and reviewed.     Occupational Profile and client history currently impacting functional performance  Pt is independent at baseline  and is motivated to return to her highest level of functioning.     Occupational performance deficits (Please refer to evaluation for details):  ADL's;IADL's;Rest and Sleep;Leisure    Rehab Potential  Good    OT Frequency  2x / week    OT Duration  4 weeks    OT Treatment/Interventions  Self-care/ADL training;Therapeutic exercise;Ultrasound;Manual Therapy;Therapeutic activities;Cryotherapy;Electrical Stimulation;Moist Heat;Passive range of motion;Patient/family education    Plan  P: Pt will benefit from skilled OT services to decrease pain and fascial restrictions, increase ROM, strength, and functional use of RUE as dominant. Treatment plan: myofascial release and manual therapy, P/ROM, A/ROM, general RUE strengthening and stability, scapular mobility and strengthening, modalities prn    Clinical Decision Making  Limited treatment options, no task modification necessary    Consulted and Agree with Plan of Care  Patient       Patient will benefit from skilled therapeutic intervention in order to improve the following deficits and impairments:  Decreased activity tolerance, Decreased strength, Impaired flexibility, Decreased range of motion, Pain, Increased fascial restrictions, Impaired UE functional use  Visit Diagnosis: Acute pain of right shoulder  Other symptoms and signs involving the musculoskeletal system    Problem List Patient Active Problem List   Diagnosis Date Noted  . PATELLAR DISLOCATION, LEFT 02/13/2010  . TEAR M C L 01/09/2010  . MEDIAL MENISCUS TEAR, LEFT 01/02/2010   Guadelupe Sabin, OTR/L  [phone] 06/24/2018, 1:24 PM  [facility] [address] [place], [place], [address] Phone: [phone]   Fax:  [phone]  Name: Ann Ortega MRN: [id number] Date of Birth: 1946-02-27

## 2018-06-24 NOTE — Patient Instructions (Signed)
  1) Flexion Wall Stretch    Face wall, place affected handon wall in front of you. Slide hand up the wall  and lean body in towards the wall. Hold for 10 seconds. Repeat 3-5 times. 1-2 times/day.     2) Towel Stretch with Internal Rotation   Or     Gently pull up (or to the side) your affected arm  behind your back with the assist of a towel. Hold 10 seconds, repeat 3-5 times. 1-2 times/day.             3) Corner Stretch    Stand at a corner of a wall, place your arms on the walls with elbows bent. Lean into the corner until a stretch is felt along the front of your chest and/or shoulders. Hold for 10 seconds. Repeat 3-5X, 1-2 times/day.    4) Posterior Capsule Stretch    Bring the involved arm across chest. Grasp elbow and pull toward chest until you feel a stretch in the back of the upper arm and shoulder. Hold 10 seconds. Repeat 3-5X. Complete 1-2 times/day.    5) Scapular Retraction    Tuck chin back as you pinch shoulder blades together.  Hold 5 seconds. Repeat 3-5X. Complete 1-2 times/day.    6) External Rotation Stretch:     Place your affected hand on the wall with the elbow bent and gently turn your body the opposite direction until a stretch is felt. Hold 10 seconds, repeat 3-5X. Complete 1-2 times/day.     

## 2018-07-01 ENCOUNTER — Ambulatory Visit (HOSPITAL_COMMUNITY): Payer: Medicare Other | Attending: Orthopedic Surgery

## 2018-07-01 ENCOUNTER — Encounter (HOSPITAL_COMMUNITY): Payer: Self-pay

## 2018-07-01 DIAGNOSIS — M25511 Pain in right shoulder: Secondary | ICD-10-CM | POA: Diagnosis not present

## 2018-07-01 DIAGNOSIS — R29898 Other symptoms and signs involving the musculoskeletal system: Secondary | ICD-10-CM | POA: Diagnosis not present

## 2018-07-01 NOTE — Therapy (Signed)
[place], [place], [address] Phone: [phone]   Fax:  [phone]  Occupational Therapy Treatment  Patient Details  Name: Ann Ortega MRN: [id number] Date of Birth: [birth year] Referring Provider (OT): Dr. Arther Abbott   Encounter Date: 07/01/2018  OT End of Session - 07/01/18 1109    Visit Number  2    Number of Visits  8    Date for OT Re-Evaluation  07/24/18    Authorization Type  Medicare Part A & B; [employer]    OT Start Time  0923    OT Stop Time  1115    OT Time Calculation (min)  40 min    Activity Tolerance  Patient tolerated treatment well    Behavior During Therapy  [facility] for tasks assessed/performed       Past Medical History:  Diagnosis Date  . Thyroid disease     Past Surgical History:  Procedure Laterality Date  . BREAST EXCISIONAL BIOPSY Left     There were no vitals filed for this visit.  Subjective Assessment - 07/01/18 1055    Subjective   S: My shoulder feels ok. I have some pain in my neck.    Currently in Pain?  Yes    Pain Score  2     Pain Location  Neck    Pain Orientation  Right    Pain Descriptors / Indicators  Aching;Sore    Pain Type  Acute pain    Pain Radiating Towards  N/A    Pain Onset  Today    Pain Frequency  Intermittent    Aggravating Factors   movement    Pain Relieving Factors  ice, pain medication    Effect of Pain on Daily Activities  min effect    Multiple Pain Sites  No         [facility] OT Assessment - 07/01/18 1058      Assessment   Medical Diagnosis  right shoulder pain      Precautions   Precautions  None               OT Treatments/Exercises (OP) - 07/01/18 1102      Exercises   Exercises  Shoulder      Shoulder Exercises: Supine   Protraction  PROM;5 reps;AROM;10 reps    Horizontal ABduction  PROM;5 reps;AROM;10 reps    External Rotation  PROM;5 reps;AROM;10 reps    Internal Rotation  PROM;5 reps;AROM;10 reps    Flexion   PROM;5 reps;AROM;10 reps    ABduction  PROM;5 reps;AROM;10 reps      Shoulder Exercises: Standing   Protraction  AROM;10 reps    Horizontal ABduction  AROM;10 reps    External Rotation  AROM;10 reps    Internal Rotation  AROM;10 reps    Flexion  AROM;10 reps    ABduction  AROM;10 reps      Shoulder Exercises: ROM/Strengthening   Wall Wash  1'     Other ROM/Strengthening Exercises  Using washclothe, patient complete proximal shoulder strengthening on door for 1'       Manual Therapy   Manual Therapy  Myofascial release    Manual therapy comments  manual therapy completed prior to exercises.     Myofascial Release  Myofascial release and manual stretching completed to right upper arm, trapezius, and scapularis region to decrease fascial restrictions and increase joint mobility in a pain zone.  OT Education - 07/01/18 1109    Education Details  Reviewed therapy goals    Person(s) Educated  Patient    Methods  Explanation    Comprehension  Verbalized understanding       OT Short Term Goals - 07/01/18 1058      OT SHORT TERM GOAL #1   Title  Pt will be provided with and educated on HEP to improve ability to use RUE as dominant during ADLs.     Time  4    Period  Weeks    Status  On-going      OT SHORT TERM GOAL #2   Title  Pt will decrease pain in RUE to 3/10 or less to improve ability to sleep.     Time  4    Period  Weeks    Status  On-going      OT SHORT TERM GOAL #3   Title  Pt will improve A/ROM to WNL in RUE to improve ability to reach overhead during ADL completion.    Time  4    Period  Weeks    Status  On-going      OT SHORT TERM GOAL #4   Title  Pt will decrease RUE fascial restrictions to minimal amounts or less to improve mobility required for functional reaching tasks.     Time  4    Period  Weeks    Status  On-going      OT SHORT TERM GOAL #5   Title  Pt will increase RUE strength to 4+/5 or greater to improve ability to lift  pot/pans during cooking tasks.     Time  4    Period  Weeks    Status  On-going               Plan - 07/01/18 1112    Clinical Impression Statement  A: Initiated myofascial release, manual stretching, and A/ROM. patient with reports of muscle fatigue and slight soreness during session although was able to complete all exercises within her pain tolerance. Pt was educated that she may feel soreness today and tomorrow from today's session and to utilize her already established pain management techniques. Patient was encouraged to complete her shoulder stretches this afternoon. During session, VC were provided for form and technique. Fasical restrictions noted in right lateral cervical region with manual techniques completed to address.     Plan  P: Continue with manual techniques as needed for fascial restrictions. Continue with A/ROM. Add X to V arms and proximal shoulder strengthening.     Consulted and Agree with Plan of Care  Patient       Patient will benefit from skilled therapeutic intervention in order to improve the following deficits and impairments:  Decreased activity tolerance, Decreased strength, Impaired flexibility, Decreased range of motion, Pain, Increased fascial restrictions, Impaired UE functional use  Visit Diagnosis: Acute pain of right shoulder  Other symptoms and signs involving the musculoskeletal system    Problem List Patient Active Problem List   Diagnosis Date Noted  . PATELLAR DISLOCATION, LEFT 02/13/2010  . TEAR M C L 01/09/2010  . MEDIAL MENISCUS TEAR, LEFT 01/02/2010   Ailene Ravel, OTR/L,CBIS  [phone]  07/01/2018, 11:14 AM  [place] [address] [place], [place], [address] Phone: [phone]   Fax:  [phone]  Name: Ann Ortega MRN: [id number] Date of Birth: 1946-03-25

## 2018-07-03 ENCOUNTER — Encounter (HOSPITAL_COMMUNITY): Payer: Self-pay | Admitting: Occupational Therapy

## 2018-07-03 ENCOUNTER — Ambulatory Visit (HOSPITAL_COMMUNITY): Payer: Medicare Other | Admitting: Occupational Therapy

## 2018-07-03 DIAGNOSIS — R29898 Other symptoms and signs involving the musculoskeletal system: Secondary | ICD-10-CM | POA: Diagnosis not present

## 2018-07-03 DIAGNOSIS — M25511 Pain in right shoulder: Secondary | ICD-10-CM | POA: Diagnosis not present

## 2018-07-03 NOTE — Therapy (Signed)
[place], [place], [address] Phone: [phone]   Fax:  [phone]  Occupational Therapy Treatment  Patient Details  Name: Ann Ortega MRN: [id number] Date of Birth: 1945/09/13 Referring Provider (OT): Dr. Arther Abbott   Encounter Date: 07/03/2018  OT End of Session - 07/03/18 1336    Visit Number  3    Number of Visits  8    Date for OT Re-Evaluation  07/24/18    Authorization Type  Medicare Part A & B; [employer]    OT Start Time  1301    OT Stop Time  1336   pt requested to leave early   OT Time Calculation (min)  35 min    Activity Tolerance  Patient tolerated treatment well    Behavior During Therapy  [facility] for tasks assessed/performed       Past Medical History:  Diagnosis Date  . Thyroid disease     Past Surgical History:  Procedure Laterality Date  . BREAST EXCISIONAL BIOPSY Left     There were no vitals filed for this visit.  Subjective Assessment - 07/03/18 1257    Subjective   S: That last session was a workout.     Currently in Pain?  No/denies         [facility] OT Assessment - 07/03/18 1257      Assessment   Medical Diagnosis  right shoulder pain      Precautions   Precautions  None               OT Treatments/Exercises (OP) - 07/03/18 1304      Exercises   Exercises  Shoulder;Neck      Neck Exercises: Stretches   Upper Trapezius Stretch  3 reps;10 seconds;Right    Levator Stretch  3 reps;10 seconds;Right      Shoulder Exercises: Supine   Protraction  PROM;5 reps;AROM;10 reps    Horizontal ABduction  PROM;5 reps;AROM;10 reps    External Rotation  PROM;5 reps;AROM;10 reps    Internal Rotation  PROM;5 reps;AROM;10 reps    Flexion  PROM;5 reps;AROM;10 reps    ABduction  PROM;5 reps;AROM;10 reps      Shoulder Exercises: Standing   Protraction  AROM;10 reps    Horizontal ABduction  AROM;10 reps    External Rotation  AROM;10 reps    Internal Rotation  AROM;10 reps     Flexion  AROM;10 reps    ABduction  AROM;10 reps      Shoulder Exercises: ROM/Strengthening   X to V Arms  10X    Proximal Shoulder Strengthening, Supine  10X each no rest breaks      Manual Therapy   Manual Therapy  Myofascial release    Manual therapy comments  manual therapy completed prior to exercises.     Myofascial Release  Myofascial release and manual stretching completed to right upper arm, trapezius, and scapularis region to decrease fascial restrictions and increase joint mobility in a pain zone.                OT Short Term Goals - 07/01/18 1058      OT SHORT TERM GOAL #1   Title  Pt will be provided with and educated on HEP to improve ability to use RUE as dominant during ADLs.     Time  4    Period  Weeks    Status  On-going      OT SHORT TERM GOAL #2  Title  Pt will decrease pain in RUE to 3/10 or less to improve ability to sleep.     Time  4    Period  Weeks    Status  On-going      OT SHORT TERM GOAL #3   Title  Pt will improve A/ROM to WNL in RUE to improve ability to reach overhead during ADL completion.    Time  4    Period  Weeks    Status  On-going      OT SHORT TERM GOAL #4   Title  Pt will decrease RUE fascial restrictions to minimal amounts or less to improve mobility required for functional reaching tasks.     Time  4    Period  Weeks    Status  On-going      OT SHORT TERM GOAL #5   Title  Pt will increase RUE strength to 4+/5 or greater to improve ability to lift pot/pans during cooking tasks.     Time  4    Period  Weeks    Status  On-going               Plan - 07/03/18 1336    Clinical Impression Statement  A: Continued with manual therapy to address fascial restrictions along right lateral cervical region along with trapezius and scapularis regions causing pain and ROM limitations. Continued with A/ROM, adding x to v arms and proximal shoulder strengthening. Min fatigue during exercises, occasional rest breaks  provided. Added upper trapezius stretch and levator stretch, pt reporting more tightness during levator stretch. Verbal cuing for form and technique during session. Pt requested to leave early due to needing to pick up her granddaughter.     Plan  P: Continue with manual techniques for fascial restrictions, add proximal shoulder strengthening in standing. Attempt w arms and add scapular theraband       Patient will benefit from skilled therapeutic intervention in order to improve the following deficits and impairments:  Decreased activity tolerance, Decreased strength, Impaired flexibility, Decreased range of motion, Pain, Increased fascial restrictions, Impaired UE functional use  Visit Diagnosis: Acute pain of right shoulder  Other symptoms and signs involving the musculoskeletal system    Problem List Patient Active Problem List   Diagnosis Date Noted  . PATELLAR DISLOCATION, LEFT 02/13/2010  . TEAR M C L 01/09/2010  . MEDIAL MENISCUS TEAR, LEFT 01/02/2010   Guadelupe Sabin, OTR/L  [phone] 07/03/2018, 1:40 PM  [place] [address] [place], [place], [address] Phone: [phone]   Fax:  [phone]  Name: Ann Ortega MRN: [id number] Date of Birth: Nov 20, 1945

## 2018-07-07 ENCOUNTER — Emergency Department (HOSPITAL_COMMUNITY): Payer: Medicare Other

## 2018-07-07 ENCOUNTER — Emergency Department (HOSPITAL_COMMUNITY)
Admission: EM | Admit: 2018-07-07 | Discharge: 2018-07-07 | Disposition: A | Discharge status: Home / Self Care | Payer: Medicare Other | Attending: Emergency Medicine | Admitting: Emergency Medicine

## 2018-07-07 ENCOUNTER — Other Ambulatory Visit: Payer: Self-pay

## 2018-07-07 ENCOUNTER — Encounter (HOSPITAL_COMMUNITY): Payer: Self-pay | Admitting: Emergency Medicine

## 2018-07-07 DIAGNOSIS — Z79899 Other long term (current) drug therapy: Secondary | ICD-10-CM | POA: Diagnosis not present

## 2018-07-07 DIAGNOSIS — W19XXXA Unspecified fall, initial encounter: Secondary | ICD-10-CM

## 2018-07-07 DIAGNOSIS — M25512 Pain in left shoulder: Secondary | ICD-10-CM | POA: Insufficient documentation

## 2018-07-07 DIAGNOSIS — S4992XA Unspecified injury of left shoulder and upper arm, initial encounter: Secondary | ICD-10-CM | POA: Diagnosis not present

## 2018-07-07 MED ORDER — TRAMADOL HCL 50 MG PO TABS: 50.0000 mg | ORAL_TABLET | Freq: Four times a day (QID) | ORAL | 0 refills | Status: DC | PRN

## 2018-07-07 NOTE — ED Provider Notes (Signed)
[facility] EMERGENCY DEPARTMENT Provider Note   CSN: [id number] Arrival date & time: 07/07/18  1848     History   Chief Complaint Chief Complaint  Patient presents with  . Fall    HPI Ann Ortega is a 72 y.o. female.  The history is provided by the patient. No language interpreter was used.  Fall  This is a new problem. The problem occurs constantly. The problem has been gradually worsening. Nothing aggravates the symptoms. Nothing relieves the symptoms. She has tried nothing for the symptoms.  Pt reports she fell today and caught herself with her hands.  Pt complains of pain in her left shoulder.    Past Medical History:  Diagnosis Date  . Thyroid disease     Patient Active Problem List   Diagnosis Date Noted  . PATELLAR DISLOCATION, LEFT 02/13/2010  . TEAR M C L 01/09/2010  . MEDIAL MENISCUS TEAR, LEFT 01/02/2010    Past Surgical History:  Procedure Laterality Date  . BREAST EXCISIONAL BIOPSY Left      OB History   None      Home Medications    Prior to Admission medications   Medication Sig Start Date End Date Taking? Authorizing Provider  docusate sodium (COLACE) 100 MG capsule Take 100-200 mg by mouth daily as needed for mild constipation.    [provider]  ibuprofen (ADVIL,MOTRIN) 200 MG tablet Take 200 mg by mouth every 6 (six) hours as needed for mild pain or moderate pain.    [provider]  levothyroxine (SYNTHROID, LEVOTHROID) 50 MCG tablet  01/18/16   [provider]  pantoprazole (PROTONIX) 20 MG tablet Take 1 tablet (20 mg total) by mouth daily. 08/04/17   Milton Ferguson, MD  traMADol (ULTRAM) 50 MG tablet Take 1 tablet (50 mg total) by mouth every 6 (six) hours as needed. 07/07/18   Fransico Meadow, PA-C    Family History History reviewed. No pertinent family history.  Social History Social History   Tobacco Use  . Smoking status: Never Smoker  . Smokeless tobacco: Never Used  Substance Use Topics  . Alcohol  use: Yes    Comment: occ  . Drug use: No     Allergies   Patient has no known allergies.   Review of Systems Review of Systems  Musculoskeletal: Positive for arthralgias. Negative for neck pain and neck stiffness.  All other systems reviewed and are negative.    Physical Exam Updated Vital Signs BP (!) 143/69 (BP Location: Right Arm)   Pulse 73   Temp 97.7 F (36.5 C) (Oral)   Resp 16   Ht 5\' 2"  (1.575 m)   Wt 68.5 kg   SpO2 99%   BMI 27.62 kg/m   Physical Exam  Constitutional: She appears well-developed and well-nourished.  Musculoskeletal: She exhibits tenderness.  Tender left shoulder, pain with movement, decreased range of motion nv and ns intact   Neurological: She is alert.  Skin: Skin is warm.  Psychiatric: She has a normal mood and affect.  Nursing note and vitals reviewed.    ED Treatments / Results  Labs (all labs ordered are listed, but only abnormal results are displayed) Labs Reviewed - No data to display  EKG None  Radiology Dg Shoulder Left  Result Date: 07/07/2018 CLINICAL DATA:  Fall and pain. EXAM: LEFT SHOULDER - 2+ VIEW COMPARISON:  11/25/2014 FINDINGS: Visualized portion of the left hemithorax is normal. The attempted axillary view is suboptimal secondary to patient positioning.  Given this factor, no fracture or dislocation. Degenerative changes about the acromioclavicular and glenohumeral joints. IMPRESSION: Degenerative change, without acute osseous finding. Electronically Signed   By: Abigail Miyamoto M.D.   On: 07/07/2018 20:57    Procedures Procedures (including critical care time)  Medications Ordered in ED Medications - No data to display   Initial Impression / Assessment and Plan / ED Course  I have reviewed the triage vital signs and the nursing notes.  Pertinent labs & imaging results that were available during my care of the patient were reviewed by me and considered in my medical decision making (see chart for details).      MDM  Xray no fracture.  Pt placed in a sling.  Rx for tramadol.  Pt advised to follow up with Dr. Aline Brochure for recheck   Final Clinical Impressions(s) / ED Diagnoses   Final diagnoses:  Fall, initial encounter  Acute pain of left shoulder    ED Discharge Orders         Ordered    traMADol (ULTRAM) 50 MG tablet  Every 6 hours PRN     07/07/18 2112        An After Visit Summary was printed and given to the patient.    Fransico Meadow, Hershal Coria 07/07/18 2233    Varney Biles, MD 07/08/18 0000

## 2018-07-07 NOTE — ED Triage Notes (Signed)
Pt c/o left shoulder/neck pain after fall earlier today. Pt states she tripped stepping on sidewalk. She states she tried to catch herself with her hands.

## 2018-07-08 ENCOUNTER — Telehealth (HOSPITAL_COMMUNITY): Payer: Self-pay | Admitting: Occupational Therapy

## 2018-07-08 ENCOUNTER — Ambulatory Visit (HOSPITAL_COMMUNITY): Payer: Medicare Other | Admitting: Occupational Therapy

## 2018-07-08 NOTE — Telephone Encounter (Signed)
She fell and will see Dr Aline Brochure before she comes in on Thurs 07/15/18

## 2018-07-10 ENCOUNTER — Encounter (HOSPITAL_COMMUNITY): Payer: Medicare Other

## 2018-07-11 ENCOUNTER — Encounter

## 2018-07-11 ENCOUNTER — Ambulatory Visit (INDEPENDENT_AMBULATORY_CARE_PROVIDER_SITE_OTHER): Payer: Medicare Other | Admitting: Orthopedic Surgery

## 2018-07-11 ENCOUNTER — Encounter: Payer: Self-pay | Admitting: Orthopedic Surgery

## 2018-07-11 VITALS — BP 136/87 | HR 69 | Ht 61.0 in | Wt 144.0 lb

## 2018-07-11 DIAGNOSIS — S46012A Strain of muscle(s) and tendon(s) of the rotator cuff of left shoulder, initial encounter: Secondary | ICD-10-CM | POA: Diagnosis not present

## 2018-07-11 DIAGNOSIS — M25512 Pain in left shoulder: Secondary | ICD-10-CM

## 2018-07-11 NOTE — Progress Notes (Signed)
  NEW PROBLEM  OFFICE VISIT  Chief Complaint  Patient presents with  . Shoulder Injury    ER follow up, left shoulder injury, DOI 07-07-18.    72 year old female presents with a new problem in her left shoulder.  The patient reports pain in the left shoulder after falling onto her left arm on December 9.  She noted that she could not move her arm after the fall and has not been able to lift her arm away from her body since the fall.  She complains of 9 out of 10 dull diffuse anterior and lateral shoulder pain with associated weakness in abduction and flexion which has not been relieved by ibuprofen.  She did go to the emergency room for x-rays which were done no fracture was seen she was placed in a sling started on tramadol and sent here for follow-up  They did see arthritis in the acromioclavicular and glenohumeral joint   Review of Systems  Musculoskeletal: Positive for joint pain. Negative for neck pain.  Neurological: Negative for tingling.     Past Medical History:  Diagnosis Date  . Thyroid disease     Past Surgical History:  Procedure Laterality Date  . BREAST EXCISIONAL BIOPSY Left     No family history on file. Social History   Tobacco Use  . Smoking status: Never Smoker  . Smokeless tobacco: Never Used  Substance Use Topics  . Alcohol use: Yes    Comment: occ  . Drug use: No    No Known Allergies  No outpatient medications have been marked as taking for the 07/11/18 encounter (Office Visit) with Carole Civil, MD.    BP 136/87   Pulse 69   Ht 5\' 1"  (1.549 m)   Wt 144 lb (65.3 kg)   BMI 27.21 kg/m   Physical Exam Vitals signs reviewed.  Constitutional:      Appearance: She is well-developed.  Neurological:     Mental Status: She is alert and oriented to person, place, and time.  Psychiatric:        Judgment: Judgment normal.     Ortho Exam  Right shoulder exam after injection last week she comes back in with full range of motion in  that arm no tenderness strength excellent skin normal no instability normal pulse and sensation without lymphadenopathy  Left shoulder is tender over the greater tuberosity with painful passive and active range of motion and loss of motion in abduction flexion positive drop test for rotator cuff tear painful external rotation at terminal range of motion but no instability the weakness is in the supraspinatus tendon with normal strength in the internal and external rotators.  The skin is warm dry and intact she has normal pulse without lymphadenopathy and no sensory deficits are elicited  MEDICAL DECISION SECTION  Xrays were done at [facility]  My independent reading of xrays:  I interpret her image as arthritis in the glenohumeral and acromioclavicular joint without fracture or dislocation a total of 3 x-rays AP lateral and transthoracic were performed  Encounter Diagnosis  Name Primary?  . Traumatic complete tear of left rotator cuff, initial encounter Yes    PLAN: (Rx., injectx, surgery, frx, mri/ct) MRI left shoulder suspect left rotator cuff tear which will require surgery  Continue rest, sling, tramadol, Tylenol ibuprofen for pain  No orders of the defined types were placed in this encounter.   Arther Abbott, MD  07/11/2018 11:30 AM

## 2018-07-15 ENCOUNTER — Ambulatory Visit (HOSPITAL_COMMUNITY): Payer: Medicare Other | Admitting: Occupational Therapy

## 2018-07-15 ENCOUNTER — Encounter (HOSPITAL_COMMUNITY): Payer: Self-pay | Admitting: Occupational Therapy

## 2018-07-15 DIAGNOSIS — R29898 Other symptoms and signs involving the musculoskeletal system: Secondary | ICD-10-CM | POA: Diagnosis not present

## 2018-07-15 DIAGNOSIS — M25511 Pain in right shoulder: Secondary | ICD-10-CM

## 2018-07-15 NOTE — Therapy (Signed)
[place], [place], [address] Phone: [phone]   Fax:  [phone]  Occupational Therapy Treatment  Patient Details  Name: Ann Ortega MRN: [id number] Date of Birth: [birth year] Referring Provider (OT): Dr. Arther Abbott   Encounter Date: 07/15/2018  OT End of Session - 07/15/18 1204    Visit Number  4    Number of Visits  8    Date for OT Re-Evaluation  07/24/18    Authorization Type  Medicare Part A & B; [employer]    OT Start Time  1119    OT Stop Time  1202    OT Time Calculation (min)  43 min    Activity Tolerance  Patient tolerated treatment well    Behavior During Therapy  [facility] for tasks assessed/performed       Past Medical History:  Diagnosis Date  . Thyroid disease     Past Surgical History:  Procedure Laterality Date  . BREAST EXCISIONAL BIOPSY Left     There were no vitals filed for this visit.  Subjective Assessment - 07/15/18 1119    Subjective   S: I fell last week and now my left shoulder is all messed up.     Currently in Pain?  Yes    Pain Score  7     Pain Location  Shoulder    Pain Orientation  Right    Pain Descriptors / Indicators  Aching;Sore    Pain Type  Acute pain    Pain Radiating Towards  N/A    Pain Onset  In the past 7 days    Pain Frequency  Intermittent    Aggravating Factors   movement    Pain Relieving Factors  ice, pain medication    Effect of Pain on Daily Activities  min effect         [facility] OT Assessment - 07/15/18 1117      Assessment   Medical Diagnosis  right shoulder pain      Precautions   Precautions  None               OT Treatments/Exercises (OP) - 07/15/18 1117      Exercises   Exercises  Shoulder;Neck      Neck Exercises: Stretches   Upper Trapezius Stretch  3 reps;10 seconds;Right    Upper Trapezius Stretch Limitations  Unable to use LUE, therefore modified by having pt hold onto chair with RUE and lean away, leaning head  over towards shoulder      Shoulder Exercises: Supine   Protraction  PROM;5 reps;AROM;10 reps    Horizontal ABduction  PROM;5 reps;AROM;10 reps    External Rotation  PROM;5 reps;AROM;10 reps    Internal Rotation  PROM;5 reps;AROM;10 reps    Flexion  PROM;5 reps;AROM;10 reps    ABduction  PROM;5 reps;AROM;10 reps      Shoulder Exercises: Standing   Protraction  AROM;10 reps    Horizontal ABduction  AROM;10 reps    External Rotation  AROM;10 reps    Internal Rotation  AROM;10 reps    Flexion  AROM;10 reps    ABduction  AROM;10 reps      Shoulder Exercises: ROM/Strengthening   Other ROM/Strengthening Exercises  Using washclothe, patient complete proximal shoulder strengthening on door for 1'       Shoulder Exercises: Stretch   Cross Chest Stretch  2 reps;10 seconds      Manual Therapy   Manual Therapy  Myofascial  release    Manual therapy comments  manual therapy completed prior to exercises.     Myofascial Release  Myofascial release and manual stretching completed to right upper arm, trapezius, and scapularis region to decrease fascial restrictions and increase joint mobility in a pain zone.                OT Short Term Goals - 07/01/18 1058      OT SHORT TERM GOAL #1   Title  Pt will be provided with and educated on HEP to improve ability to use RUE as dominant during ADLs.     Time  4    Period  Weeks    Status  On-going      OT SHORT TERM GOAL #2   Title  Pt will decrease pain in RUE to 3/10 or less to improve ability to sleep.     Time  4    Period  Weeks    Status  On-going      OT SHORT TERM GOAL #3   Title  Pt will improve A/ROM to WNL in RUE to improve ability to reach overhead during ADL completion.    Time  4    Period  Weeks    Status  On-going      OT SHORT TERM GOAL #4   Title  Pt will decrease RUE fascial restrictions to minimal amounts or less to improve mobility required for functional reaching tasks.     Time  4    Period  Weeks    Status   On-going      OT SHORT TERM GOAL #5   Title  Pt will increase RUE strength to 4+/5 or greater to improve ability to lift pot/pans during cooking tasks.     Time  4    Period  Weeks    Status  On-going               Plan - 07/15/18 1204    Clinical Impression Statement  A: Pt fell last week and has injured her left shoulder, has MRI scheduled for tomorrow, therefore unable to complete w arms or scapular theraband. Continued with manual therapy to address large muscle knot palpated at upper trapezius region. Pt with ROM WNL during passive stretching, WFL during A/ROM. Completed proximal shoulder strengthening at doorway as pt unable to complete with LUE. Pt reporting decreased pain in RUE at end of session. Verbal cuing for form and technique during session.      Plan  P: continue with manual techniques for fascial restrictions, increase repetitions to 15, attempt ball on wall       Patient will benefit from skilled therapeutic intervention in order to improve the following deficits and impairments:  Decreased activity tolerance, Decreased strength, Impaired flexibility, Decreased range of motion, Pain, Increased fascial restrictions, Impaired UE functional use  Visit Diagnosis: Acute pain of right shoulder  Other symptoms and signs involving the musculoskeletal system    Problem List Patient Active Problem List   Diagnosis Date Noted  . PATELLAR DISLOCATION, LEFT 02/13/2010  . TEAR M C L 01/09/2010  . MEDIAL MENISCUS TEAR, LEFT 01/02/2010   Guadelupe Sabin, OTR/L  [phone] 07/15/2018, 12:09 PM  [place] [address] [place], [place], [address] Phone: [phone]   Fax:  [phone]  Name: AZUCENA DART MRN: [id number] Date of Birth: April 11, 1946

## 2018-07-16 ENCOUNTER — Ambulatory Visit

## 2018-07-17 ENCOUNTER — Ambulatory Visit (HOSPITAL_COMMUNITY)
Admission: RE | Admit: 2018-07-17 | Discharge: 2018-07-17 | Disposition: A | Discharge status: Home / Self Care | Payer: Medicare Other | Source: Ambulatory Visit | Attending: Orthopedic Surgery | Admitting: Orthopedic Surgery

## 2018-07-17 ENCOUNTER — Ambulatory Visit (HOSPITAL_COMMUNITY): Payer: Medicare Other | Admitting: Occupational Therapy

## 2018-07-17 ENCOUNTER — Encounter (HOSPITAL_COMMUNITY): Payer: Self-pay | Admitting: Occupational Therapy

## 2018-07-17 DIAGNOSIS — M25512 Pain in left shoulder: Secondary | ICD-10-CM | POA: Diagnosis not present

## 2018-07-17 DIAGNOSIS — M25511 Pain in right shoulder: Secondary | ICD-10-CM

## 2018-07-17 DIAGNOSIS — M75122 Complete rotator cuff tear or rupture of left shoulder, not specified as traumatic: Secondary | ICD-10-CM | POA: Insufficient documentation

## 2018-07-17 DIAGNOSIS — M19012 Primary osteoarthritis, left shoulder: Secondary | ICD-10-CM | POA: Diagnosis not present

## 2018-07-17 DIAGNOSIS — R29898 Other symptoms and signs involving the musculoskeletal system: Secondary | ICD-10-CM

## 2018-07-17 DIAGNOSIS — S4992XA Unspecified injury of left shoulder and upper arm, initial encounter: Secondary | ICD-10-CM | POA: Diagnosis not present

## 2018-07-17 NOTE — Therapy (Signed)
[place], [place], [address] Phone: [phone]   Fax:  [phone]  Occupational Therapy Treatment  Patient Details  Name: Ann Ortega MRN: [id number] Date of Birth: December 29, 1945 Referring Provider (OT): Dr. Arther Abbott   Encounter Date: 07/17/2018  OT End of Session - 07/17/18 1205    Visit Number  5    Number of Visits  8    Date for OT Re-Evaluation  07/24/18    Authorization Type  Medicare Part A & B; [employer]    OT Start Time  6734    OT Stop Time  1201    OT Time Calculation (min)  45 min    Activity Tolerance  Patient tolerated treatment well    Behavior During Therapy  [facility] for tasks assessed/performed       Past Medical History:  Diagnosis Date  . Thyroid disease     Past Surgical History:  Procedure Laterality Date  . BREAST EXCISIONAL BIOPSY Left     There were no vitals filed for this visit.  Subjective Assessment - 07/17/18 1114    Subjective   S: I went and got my MRI this morning for my other shoulder.     Currently in Pain?  Yes    Pain Score  8     Pain Location  Shoulder    Pain Orientation  Right    Pain Descriptors / Indicators  Aching;Sore    Pain Type  Acute pain    Pain Radiating Towards  N/A    Pain Onset  Yesterday    Pain Frequency  Intermittent    Aggravating Factors   movement    Pain Relieving Factors  ice, pain medication    Effect of Pain on Daily Activities  min effect    Multiple Pain Sites  No         [facility] OT Assessment - 07/17/18 1114      Assessment   Medical Diagnosis  right shoulder pain      Precautions   Precautions  None               OT Treatments/Exercises (OP) - 07/17/18 1119      Exercises   Exercises  Shoulder;Neck      Shoulder Exercises: Supine   Protraction  PROM;5 reps;AROM;10 reps    Horizontal ABduction  PROM;5 reps;AROM;10 reps    External Rotation  PROM;5 reps;AROM;10 reps    Internal Rotation  PROM;5  reps;AROM;10 reps    Flexion  PROM;5 reps;AROM;10 reps    ABduction  PROM;5 reps;AROM;10 reps      Shoulder Exercises: Standing   Protraction  AROM;10 reps    Horizontal ABduction  AROM;10 reps    External Rotation  AROM;10 reps    Internal Rotation  AROM;10 reps    Flexion  AROM;10 reps    ABduction  AROM;10 reps    Diagonals  AROM;10 reps      Shoulder Exercises: ROM/Strengthening   Other ROM/Strengthening Exercises  Using washclothe, patient complete proximal shoulder strengthening on door for 1'       Functional Reaching Activities   Mid Level  pt placed 10 cones on middle shelf of cabinet with 1# weight, mod difficulty. Removed without weight with min difficulty. OT cuing for form       Modalities   Modalities  Moist Heat      Moist Heat Therapy   Number Minutes Moist Heat  10 Minutes  Moist Heat Location  Shoulder      Manual Therapy   Manual Therapy  Myofascial release    Manual therapy comments  manual therapy completed prior to exercises.     Myofascial Release  Myofascial release and manual stretching completed to right upper arm, trapezius, and scapularis region to decrease fascial restrictions and increase joint mobility in a pain zone.                OT Short Term Goals - 07/01/18 1058      OT SHORT TERM GOAL #1   Title  Pt will be provided with and educated on HEP to improve ability to use RUE as dominant during ADLs.     Time  4    Period  Weeks    Status  On-going      OT SHORT TERM GOAL #2   Title  Pt will decrease pain in RUE to 3/10 or less to improve ability to sleep.     Time  4    Period  Weeks    Status  On-going      OT SHORT TERM GOAL #3   Title  Pt will improve A/ROM to WNL in RUE to improve ability to reach overhead during ADL completion.    Time  4    Period  Weeks    Status  On-going      OT SHORT TERM GOAL #4   Title  Pt will decrease RUE fascial restrictions to minimal amounts or less to improve mobility required for  functional reaching tasks.     Time  4    Period  Weeks    Status  On-going      OT SHORT TERM GOAL #5   Title  Pt will increase RUE strength to 4+/5 or greater to improve ability to lift pot/pans during cooking tasks.     Time  4    Period  Weeks    Status  On-going               Plan - 07/17/18 1206    Clinical Impression Statement  A: Pt reports increased pain today, however did not get out of bed yesterday or complete any stretches to shoulder. Pt able to complete exercises without difficulty, did not increase repetitions due to pain. Added functional reaching activity with and without 1# weight. Verbal cuing for form and technique, moist heat applied at end of session for pain. Pt reporting decreased pain after moist heat.     Plan  P: Increase repetitions to 12 or 15, attempt ball on wall       Patient will benefit from skilled therapeutic intervention in order to improve the following deficits and impairments:  Decreased activity tolerance, Decreased strength, Impaired flexibility, Decreased range of motion, Pain, Increased fascial restrictions, Impaired UE functional use  Visit Diagnosis: Acute pain of right shoulder  Other symptoms and signs involving the musculoskeletal system    Problem List Patient Active Problem List   Diagnosis Date Noted  . PATELLAR DISLOCATION, LEFT 02/13/2010  . TEAR M C L 01/09/2010  . MEDIAL MENISCUS TEAR, LEFT 01/02/2010   Guadelupe Sabin, OTR/L  [phone] 07/17/2018, 12:08 PM  [facility] [address] [place], [place], [address] Phone: [phone]   Fax:  [phone]  Name: RICKEYA MANUS MRN: [id number] Date of Birth: 1946-06-13

## 2018-07-21 ENCOUNTER — Ambulatory Visit (HOSPITAL_COMMUNITY): Payer: Medicare Other

## 2018-07-21 ENCOUNTER — Encounter (HOSPITAL_COMMUNITY): Payer: Self-pay

## 2018-07-21 DIAGNOSIS — M25511 Pain in right shoulder: Secondary | ICD-10-CM

## 2018-07-21 DIAGNOSIS — R29898 Other symptoms and signs involving the musculoskeletal system: Secondary | ICD-10-CM

## 2018-07-21 NOTE — Therapy (Signed)
[place], [place], [address] Phone: [phone]   Fax:  [phone]  Occupational Therapy Treatment  Patient Details  Name: Ann Ortega MRN: [id number] Date of Birth: Nov 06, 1945 Referring Provider (OT): Dr. Arther Abbott   Encounter Date: 07/21/2018  OT End of Session - 07/21/18 1115    Visit Number  6    Number of Visits  8    Date for OT Re-Evaluation  07/24/18    Authorization Type  Medicare Part A & B; [employer]    OT Start Time  1036    OT Stop Time  1115    OT Time Calculation (min)  39 min    Activity Tolerance  Patient tolerated treatment well    Behavior During Therapy  [facility] for tasks assessed/performed       Past Medical History:  Diagnosis Date  . Thyroid disease     Past Surgical History:  Procedure Laterality Date  . BREAST EXCISIONAL BIOPSY Left     There were no vitals filed for this visit.  Subjective Assessment - 07/21/18 1041    Subjective   S: My shoulder is sore today. I haven't done any exercises with it today.    Currently in Pain?  Yes    Pain Score  7     Pain Location  Shoulder    Pain Orientation  Right    Pain Descriptors / Indicators  Aching;Sore    Pain Type  Acute pain         [facility] OT Assessment - 07/21/18 1056      Assessment   Medical Diagnosis  right shoulder pain      Precautions   Precautions  None               OT Treatments/Exercises (OP) - 07/21/18 1057      Exercises   Exercises  Shoulder      Shoulder Exercises: Supine   Protraction  PROM;5 reps;AROM;12 reps    Horizontal ABduction  PROM;5 reps;AROM;12 reps    External Rotation  PROM;5 reps;AROM;12 reps    Internal Rotation  PROM;5 reps;AROM;12 reps    Flexion  PROM;5 reps;AROM;12 reps    ABduction  PROM;5 reps;AROM;12 reps      Shoulder Exercises: Standing   Protraction  AROM;12 reps    Horizontal ABduction  AROM;12 reps    External Rotation  AROM;12 reps    Internal  Rotation  AROM;12 reps    Flexion  AROM;12 reps    ABduction  AROM;12 reps      Shoulder Exercises: ROM/Strengthening   X to V Arms  10X    Proximal Shoulder Strengthening, Seated  12X A/ROM no rest breaks    Ball on Wall  1' flexion 1' abduction green ball      Manual Therapy   Manual Therapy  Myofascial release    Manual therapy comments  manual therapy completed prior to exercises.     Myofascial Release  Myofascial release and manual stretching completed to right upper arm, trapezius, and scapularis region to decrease fascial restrictions and increase joint mobility in a pain zone.              OT Education - 07/21/18 1101    Education Details  Discussed ways to modify tasks at home such as placing plates and cups on shelf at home and instead placing them on the countertop.     Person(s) Educated  Patient  Methods  Explanation    Comprehension  Verbalized understanding       OT Short Term Goals - 07/01/18 1058      OT SHORT TERM GOAL #1   Title  Pt will be provided with and educated on HEP to improve ability to use RUE as dominant during ADLs.     Time  4    Period  Weeks    Status  On-going      OT SHORT TERM GOAL #2   Title  Pt will decrease pain in RUE to 3/10 or less to improve ability to sleep.     Time  4    Period  Weeks    Status  On-going      OT SHORT TERM GOAL #3   Title  Pt will improve A/ROM to WNL in RUE to improve ability to reach overhead during ADL completion.    Time  4    Period  Weeks    Status  On-going      OT SHORT TERM GOAL #4   Title  Pt will decrease RUE fascial restrictions to minimal amounts or less to improve mobility required for functional reaching tasks.     Time  4    Period  Weeks    Status  On-going      OT SHORT TERM GOAL #5   Title  Pt will increase RUE strength to 4+/5 or greater to improve ability to lift pot/pans during cooking tasks.     Time  4    Period  Weeks    Status  On-going                Plan - 07/21/18 1115    Clinical Impression Statement  A: Completed all exercises with RUE only as LUE MRI results have not been provided to patient. Patient complete all standing and supine shoulder exercises while increasing repetitions to 12. Pt complete ball on the wall for shoulder and scapular stability. Mild pain noted during session. VC provided for form and technique.     Plan  P: Reassessment. Determine if more therapy is needed.     Consulted and Agree with Plan of Care  Patient       Patient will benefit from skilled therapeutic intervention in order to improve the following deficits and impairments:  Decreased activity tolerance, Decreased strength, Impaired flexibility, Decreased range of motion, Pain, Increased fascial restrictions, Impaired UE functional use  Visit Diagnosis: Other symptoms and signs involving the musculoskeletal system  Acute pain of right shoulder    Problem List Patient Active Problem List   Diagnosis Date Noted  . PATELLAR DISLOCATION, LEFT 02/13/2010  . TEAR M C L 01/09/2010  . MEDIAL MENISCUS TEAR, LEFT 01/02/2010   Ailene Ravel, OTR/L,CBIS  [phone]  07/21/2018, 11:39 AM  [facility] [address] [place], [place], [address] Phone: [phone]   Fax:  [phone]  Name: Ann Ortega MRN: [id number] Date of Birth: 15-Mar-1946

## 2018-07-24 ENCOUNTER — Encounter (HOSPITAL_COMMUNITY): Payer: Self-pay | Admitting: Occupational Therapy

## 2018-07-24 ENCOUNTER — Ambulatory Visit (HOSPITAL_COMMUNITY): Payer: Medicare Other | Admitting: Occupational Therapy

## 2018-07-24 DIAGNOSIS — M25511 Pain in right shoulder: Secondary | ICD-10-CM | POA: Diagnosis not present

## 2018-07-24 DIAGNOSIS — R29898 Other symptoms and signs involving the musculoskeletal system: Secondary | ICD-10-CM | POA: Diagnosis not present

## 2018-07-24 NOTE — Therapy (Signed)
[facility] [address] [place], [place], [address] Phone: [phone]   Fax:  [phone]  Occupational Therapy Reassessment and Treatment (recertification)  Patient Details  Name: Ann Ortega MRN: [id number] Date of Birth: March 11, 1946 Referring Provider (OT): Dr. Arther Abbott   Progress Note Reporting Period 07/24/2018 to 07/24/2018  See note below for Objective Data and Assessment of Progress/Goals.       Encounter Date: 07/24/2018  OT End of Session - 07/24/18 1202    Visit Number  7    Number of Visits  15    Date for OT Re-Evaluation  08/23/18    Authorization Type  Medicare Part A & B; [employer]    OT Start Time  1118    OT Stop Time  1159    OT Time Calculation (min)  41 min    Activity Tolerance  Patient tolerated treatment well    Behavior During Therapy  [facility] for tasks assessed/performed       Past Medical History:  Diagnosis Date  . Thyroid disease     Past Surgical History:  Procedure Laterality Date  . BREAST EXCISIONAL BIOPSY Left     There were no vitals filed for this visit.  Subjective Assessment - 07/24/18 1118    Subjective   S: I've been doing my exercises every day.     Currently in Pain?  Yes    Pain Score  7     Pain Location  Shoulder    Pain Orientation  Left    Pain Descriptors / Indicators  Aching;Sore    Pain Type  Acute pain    Pain Radiating Towards  N/A    Pain Onset  1 to 4 weeks ago    Pain Frequency  Constant    Aggravating Factors   movement    Pain Relieving Factors  nothing    Effect of Pain on Daily Activities  mod/max effect on ADLs    Multiple Pain Sites  No         [facility] OT Assessment - 07/24/18 1118      Assessment   Medical Diagnosis  right shoulder pain      Precautions   Precautions  None      Observation/Other Assessments   Focus on Therapeutic Outcomes (FOTO)   53/100   same as previous     Palpation   Palpation comment  min fascial restrictions  in upper arm, trapezius, and scapularis regions      AROM   Overall AROM Comments  Assessed seated, er/IR adducted    AROM Assessment Site  Shoulder    Right/Left Shoulder  Right    Right Shoulder Flexion  175 Degrees   140 previous   Right Shoulder ABduction  180 Degrees   146 previous   Right Shoulder Internal Rotation  90 Degrees   same as previous   Right Shoulder External Rotation  60 Degrees   34 previous     PROM   Overall PROM Comments  Assessed supine, er/IR adducted    PROM Assessment Site  Shoulder    Right/Left Shoulder  Right    Right Shoulder Flexion  180 Degrees   170 previous   Right Shoulder ABduction  180 Degrees   same as previous   Right Shoulder Internal Rotation  90 Degrees   same as previous   Right Shoulder External Rotation  75 Degrees   40 previous     Strength  Overall Strength Comments  Assessed seated, er/IR adducted    Strength Assessment Site  Shoulder    Right/Left Shoulder  Right    Right Shoulder Flexion  4-/5   3/5 previous   Right Shoulder ABduction  3+/5   3/5 previous   Right Shoulder Internal Rotation  4/5   same as previous   Right Shoulder External Rotation  3/5   same as previous              OT Treatments/Exercises (OP) - 07/24/18 1122      Exercises   Exercises  Shoulder      Shoulder Exercises: Supine   Protraction  PROM;5 reps;Strengthening;10 reps    Protraction Weight (lbs)  1    Horizontal ABduction  PROM;5 reps;Strengthening;10 reps    Horizontal ABduction Weight (lbs)  1    External Rotation  PROM;5 reps;Strengthening;10 reps    External Rotation Weight (lbs)  1    Internal Rotation  PROM;5 reps;Strengthening;10 reps    Internal Rotation Weight (lbs)  1    Flexion  PROM;5 reps;Strengthening;10 reps    Shoulder Flexion Weight (lbs)  1    ABduction  PROM;5 reps;Strengthening;10 reps    Shoulder ABduction Weight (lbs)  1      Shoulder Exercises: Standing   Protraction  AROM;12 reps    Horizontal  ABduction  AROM;12 reps    External Rotation  AROM;12 reps    Internal Rotation  AROM;12 reps    Flexion  AROM;12 reps    ABduction  AROM;12 reps    Diagonals  AROM;10 reps      Shoulder Exercises: ROM/Strengthening   Proximal Shoulder Strengthening, Seated  12X A/ROM no rest breaks    Ball on Wall  1' flexion 1' abduction green ball      Manual Therapy   Manual Therapy  Myofascial release    Manual therapy comments  manual therapy completed prior to exercises.     Myofascial Release  Myofascial release and manual stretching completed to right upper arm, trapezius, and scapularis region to decrease fascial restrictions and increase joint mobility in a pain zone.                OT Short Term Goals - 07/24/18 1147      OT SHORT TERM GOAL #1   Title  Pt will be provided with and educated on HEP to improve ability to use RUE as dominant during ADLs.     Time  4    Period  Weeks    Status  On-going      OT SHORT TERM GOAL #2   Title  Pt will decrease pain in RUE to 3/10 or less to improve ability to sleep.     Time  4    Period  Weeks    Status  Achieved      OT SHORT TERM GOAL #3   Title  Pt will improve A/ROM to WNL in RUE to improve ability to reach overhead during ADL completion.    Time  4    Period  Weeks    Status  Achieved      OT SHORT TERM GOAL #4   Title  Pt will decrease RUE fascial restrictions to trace amounts to improve mobility required for functional reaching tasks.     Baseline  12/26: minimal fascial restrictions    Time  4    Period  Weeks    Status  Revised  OT SHORT TERM GOAL #5   Title  Pt will increase RUE strength to 4+/5 or greater to improve ability to lift pot/pans during cooking tasks.     Time  4    Period  Weeks    Status  On-going               Plan - 07/24/18 1149    Clinical Impression Statement  A: Reassessment completed this session, pt has met 2/5 goals with an additional goal met and revised. Pt reports  improvement in mobility of arm, continues to have difficulty with lifting items suchs as cups into cabinets due to strength deficits. Discussed progress with pt, pt is agreeable to continue therapy with focus on strength of RUE required for functional tasks. Added 1# weight in supine today, mod difficulty and fatigue during task. Verbal cuing for form and technique.     Occupational performance deficits (Please refer to evaluation for details):  ADL's;IADL's;Rest and Sleep;Leisure    Rehab Potential  Good    OT Frequency  2x / week    OT Duration  4 weeks    OT Treatment/Interventions  Self-care/ADL training;Therapeutic exercise;Ultrasound;Manual Therapy;Therapeutic activities;Cryotherapy;Electrical Stimulation;Moist Heat;Passive range of motion;Patient/family education    Plan  P: Continue with strengthening focusing on strength required for ADL completion. Only complete tasks with RUE due to LUE pain and pt awaiting MRI results. Next session: attempt 1# weight in standing.        Patient will benefit from skilled therapeutic intervention in order to improve the following deficits and impairments:  Decreased activity tolerance, Decreased strength, Impaired flexibility, Decreased range of motion, Pain, Increased fascial restrictions, Impaired UE functional use  Visit Diagnosis: Other symptoms and signs involving the musculoskeletal system  Acute pain of right shoulder    Problem List Patient Active Problem List   Diagnosis Date Noted  . PATELLAR DISLOCATION, LEFT 02/13/2010  . TEAR M C L 01/09/2010  . MEDIAL MENISCUS TEAR, LEFT 01/02/2010   Guadelupe Sabin, OTR/L  [phone] 07/24/2018, 12:02 PM  [facility] [address] [place], [place], [address] Phone: [phone]   Fax:  [phone]  Name: Ann Ortega MRN: [id number] Date of Birth: 21-Jan-1946

## 2018-08-01 ENCOUNTER — Ambulatory Visit: Payer: Medicare Other | Admitting: Orthopedic Surgery

## 2018-08-04 ENCOUNTER — Encounter: Payer: Self-pay | Admitting: Orthopedic Surgery

## 2018-08-04 ENCOUNTER — Ambulatory Visit (INDEPENDENT_AMBULATORY_CARE_PROVIDER_SITE_OTHER): Payer: Medicare Other | Admitting: Orthopedic Surgery

## 2018-08-04 VITALS — BP 128/73 | HR 78 | Ht 61.0 in | Wt 150.0 lb

## 2018-08-04 DIAGNOSIS — S46012D Strain of muscle(s) and tendon(s) of the rotator cuff of left shoulder, subsequent encounter: Secondary | ICD-10-CM | POA: Diagnosis not present

## 2018-08-04 DIAGNOSIS — S46011D Strain of muscle(s) and tendon(s) of the rotator cuff of right shoulder, subsequent encounter: Secondary | ICD-10-CM | POA: Diagnosis not present

## 2018-08-04 NOTE — Progress Notes (Signed)
FOLLOW UP VISIT : MRI RESULTS   Chief Complaint  Patient presents with  . Shoulder Pain    Rt shoulder f/u PT  . Shoulder Injury    Lt Shoulder     HPI: The patient is here TO DISCUSS THE RESULTS OF MRI  73 year old female injured her left shoulder secondary to a fall on December 9 she received an injection she got improvement in terms of her range of motion and some of her pain was relieved however she still had significant weakness and was sent for MRI   Review of Systems  Neurological: Positive for weakness. Negative for tingling.   Right shoulder pain-free regained full range of motion  BP 128/73   Pulse 78   Ht 5\' 1"  (1.549 m)   Wt 150 lb (68 kg)   BMI 28.34 kg/m   She is awake and alert she is oriented x3 mood and affect are normal Left shoulder active motion 120 degrees of flexion passive range of motion 150 degrees this is painful the shoulder is stable neurovascular exam remains intact skin is normal she has tenderness in the proximal part of her arm   Medical decision-making section   DATA  MRI REPORT: CLINICAL DATA:  Left shoulder pain for 3 months since a fall. Initial encounter.   EXAM: MRI OF THE LEFT SHOULDER WITHOUT CONTRAST   TECHNIQUE: Multiplanar, multisequence MR imaging of the shoulder was performed. No intravenous contrast was administered.   COMPARISON:  Plain films left shoulder 07/07/2018.   FINDINGS: Rotator cuff: The patient has a complete supraspinatus tendon tear extending into approximately the anterior 50% of the infraspinatus. Retraction is just medial to the top of the humeral head, 3.5-4.5 cm. The subscapularis is almost completely torn. A small band of the inferior most fibers of the tendon are intact but markedly attenuated and tendinopathic.   Muscles:  No focal atrophy or lesion.   Biceps long head:  Completely torn from the superior labrum.   Acromioclavicular Joint: Moderate osteoarthritis is seen. Type 2 acromion.  There is extensive subacromial spurring. Fluid is present in the subacromial/subdeltoid bursa.   Glenohumeral Joint: Moderately severe degenerative change is present with cartilage loss, joint space narrowing and an osteophyte off the humeral head. There is a small joint effusion containing debris, likely tiny cartilage fragments.   Labrum:  The superior labrum is severely degenerated.   Bones: No fracture or worrisome lesion. Edema in the lesser tuberosity is consistent with reactive change from rotator cuff disease.   Other: None.   IMPRESSION: Complete supraspinatus tendon tear extends into a large segment of the anterior infraspinatus with 3.5-4.5 cm of retraction. The subscapularis is almost completely torn with severe tendinopathy of intact fibers inferiorly. Retraction is mild at 1-2 cm. No atrophy of the rotator cuff.   Complete tear of the long head of biceps from the superior labrum.   Moderately severe glenohumeral osteoarthritis.   Moderate acromioclavicular osteoarthritis. Type 2 acromion with extensive subacromial spurring noted.   Subacromial/subdeltoid fluid consistent with bursitis.     Electronically Signed   By: Inge Rise M.D.   On: 07/17/2018 11:02     MY READING: MRI OF THE  Glenohumeral arthritis moderate to severe retracted rotator cuff tear supraspinatus adenopathy left shoulder  Encounter Diagnoses  Name Primary?  . Traumatic complete tear of right rotator cuff, subsequent encounter Yes  . Traumatic complete tear of left rotator cuff, subsequent encounter       PLAN:   Number  recommending physical therapy on the left shoulder 3 times a week for 6 weeks for the new left rotator cuff tear  I discussed with the patient treatment options which include cuff repair shoulder replacement physical therapy  Because she does have intact active range of motion of 130 degrees I recommend we try therapy first.  This is probably not a  repairable cuff although superior capsular reconstruction may be able to repair the rotator cuff sling, however not optimistic about that.  In any event she will come back on February 24 she has a trip scheduled for [place] on March 1  Time spent 15 min to review the mri and treatment recs.

## 2018-08-04 NOTE — Addendum Note (Signed)
Addended byCandice Camp on: 08/04/2018 04:47 PM   Modules accepted: Orders

## 2018-08-06 ENCOUNTER — Ambulatory Visit: Payer: Medicare Other | Admitting: Orthopedic Surgery

## 2018-08-06 ENCOUNTER — Ambulatory Visit (HOSPITAL_COMMUNITY): Payer: Medicare Other | Attending: Orthopedic Surgery

## 2018-08-06 ENCOUNTER — Other Ambulatory Visit: Payer: Self-pay | Admitting: Orthopedic Surgery

## 2018-08-06 ENCOUNTER — Telehealth (HOSPITAL_COMMUNITY): Payer: Self-pay

## 2018-08-06 DIAGNOSIS — R29898 Other symptoms and signs involving the musculoskeletal system: Secondary | ICD-10-CM | POA: Insufficient documentation

## 2018-08-06 DIAGNOSIS — M25612 Stiffness of left shoulder, not elsewhere classified: Secondary | ICD-10-CM | POA: Insufficient documentation

## 2018-08-06 DIAGNOSIS — M25512 Pain in left shoulder: Secondary | ICD-10-CM | POA: Diagnosis not present

## 2018-08-06 NOTE — Telephone Encounter (Signed)
Patient is asking for something for pain. States she was taking Tramadol 50 mg and that has not really helped. She is asking if Dr. Aline Brochure would prescribe something stronger for her pain.  PATIENT USES [facility]

## 2018-08-06 NOTE — Telephone Encounter (Signed)
LE D/c Right Shoulder today and did Evaluation on Left Shoulder Tear Rotator Cuff. Look for referral from Dr. Aline Brochure

## 2018-08-06 NOTE — Telephone Encounter (Signed)
SHE CAN TAKE TRAMADOL AND IBUPROFEN NO OTHER OPIOIDS

## 2018-08-06 NOTE — Patient Instructions (Addendum)
COMPLETE THE FOLLOWING WITH YOUR LEFT ARM ONLY.   TOWEL SLIDES COMPLETE FOR 1-3 MINUTES, 3-5 TIMES PER DAY  SHOULDER: Flexion On Table   Place hands on table, elbows straight. Move hips away from body. Press hands down into table. Hold ___ seconds. ___ reps per set, ___ sets per day, ___ days per week  Abduction (Passive)   With arm out to side, resting on table, lower head toward arm, keeping trunk away from table. Hold ____ seconds. Repeat ____ times. Do ____ sessions per day.  Copyright  VHI. All rights reserved.     Internal Rotation (Assistive)   Seated with elbow bent at right angle and held against side, slide arm on table surface in an inward arc. Repeat ____ times. Do ____ sessions per day. Activity: Use this motion to brush crumbs off the table.  Copyright  VHI. All rights reserved.    COMPLETE PENDULUM EXERCISES FOR 30 SECONDS TO A MINUTE EACH, 3-5 TIMES PER DAY. ROM: Pendulum (Side-to-Side)   Deje que el brazo derecho se balancee suavemente de lado a lado mientras oscila el cuerpo de [place]. Repita ____ veces por rutina. Realice ____ rutinas por sesin. Realice ____ sesiones por da.  http://orth.exer.us/792   Copyright  VHI. All rights reserved.  Pendulum Forward/Back   Bend forward 90 at waist, using table for support. Rock body forward and back to swing arm. Repeat ____ times. Do ____ sessions per day.  Copyright  VHI. All rights reserved.  Pendulum Circular   With right palm down, bend wrist up. Repeat 10____ times per set. Do ____ sets per session. Do __3__ sessions per day.      COMPLETE THE FOLLOWING WITH YOUR RIGHT ARM ONLY.   Repeat all exercises 10 times, 1-2 times per day. Hold a 1# weight, a can of soup or a water bottle.  1) Shoulder Protraction    Begin with elbows by your side, slowly "punch" straight out in front of you.      2) Shoulder Flexion  Standing:         Begin with arms at your side with thumbs  pointed up, slowly raise both arms up and forward towards overhead.        3) Horizontal abduction/adduction    Standing:           Begin with arms straight out in front of you, bring out to the side in at "T" shape. Keep arms straight entire time.        4) Internal & External Rotation    *No band* -Stand with elbows at the side and elbows bent 90 degrees. Move your forearms away from your body, then bring back inward toward the body.     5) Shoulder Abduction  Standing:        Begin with your arms flat on the table next to your side. Slowly move your arms out to the side so that they go overhead, in a jumping jack or snow angel movement.

## 2018-08-07 ENCOUNTER — Other Ambulatory Visit: Payer: Self-pay

## 2018-08-07 ENCOUNTER — Encounter (HOSPITAL_COMMUNITY): Payer: Self-pay

## 2018-08-07 NOTE — Therapy (Signed)
[facility] [place], [place], [address] Phone: [phone]   Fax:  [phone]  Patient Details  Name: Ann Ortega MRN: [id number] Date of Birth: June 12, 1946 Referring Provider:  No ref. provider found  Encounter Date: 08/07/2018  OCCUPATIONAL THERAPY DISCHARGE SUMMARY  Visits from Start of Care: 7  Patient discharged from therapy for RUE and will begin therapy on LUE due to recent RTC tear.   Current functional level related to goals / functional outcomes: AROM    Overall AROM Comments  Assessed seated, er/IR adducted    AROM Assessment Site  Shoulder    Right/Left Shoulder  Right    Right Shoulder Flexion  175 Degrees   140 previous   Right Shoulder ABduction  180 Degrees   146 previous   Right Shoulder Internal Rotation  90 Degrees   same as previous   Right Shoulder External Rotation  60 Degrees   34 previous       PROM   Overall PROM Comments  Assessed supine, er/IR adducted    PROM Assessment Site  Shoulder    Right/Left Shoulder  Right    Right Shoulder Flexion  180 Degrees   170 previous   Right Shoulder ABduction  180 Degrees   same as previous   Right Shoulder Internal Rotation  90 Degrees   same as previous   Right Shoulder External Rotation  75 Degrees   40 previous       Strength   Overall Strength Comments  Assessed seated, er/IR adducted    Strength Assessment Site  Shoulder    Right/Left Shoulder  Right    Right Shoulder Flexion  4-/5   3/5 previous   Right Shoulder ABduction  3+/5   3/5 previous   Right Shoulder Internal Rotation  4/5   same as previous   Right Shoulder External Rotation  3/5   same as previous    OT SHORT TERM GOAL #1   Title  Pt will be provided with and educated on HEP to improve ability to use RUE as dominant during ADLs.     Time  4    Period  Weeks    Status  Achieved       OT SHORT TERM GOAL #2   Title  Pt will decrease pain in  RUE to 3/10 or less to improve ability to sleep.     Time  4    Period  Weeks    Status  Achieved        OT SHORT TERM GOAL #3   Title  Pt will improve A/ROM to WNL in RUE to improve ability to reach overhead during ADL completion.    Time  4    Period  Weeks    Status  Achieved        OT SHORT TERM GOAL #4   Title  Pt will decrease RUE fascial restrictions to trace amounts to improve mobility required for functional reaching tasks.     Baseline  12/26: minimal fascial restrictions    Time  4    Period  Weeks    Status Achieved       OT SHORT TERM GOAL #5   Title  Pt will increase RUE strength to 4+/5 or greater to improve ability to lift pot/pans during cooking tasks.     Time  4    Period  Weeks    Status  partially met  Remaining deficits: Pt reports improvement in mobility of arm, continues to have difficulty with lifting items suchs as cups into cabinets due to strength deficits.   Education / Equipment: Shoulder strengthening for RUE.  Plan: Patient agrees to discharge.  Patient goals were met. Patient is being discharged due to meeting the stated rehab goals.  ?????    Ailene Ravel, OTR/L,CBIS  [phone]           08/07/2018, 10:27 AM  [facility] [address] [place], [place], [address] Phone: [phone]   Fax:  [phone]

## 2018-08-07 NOTE — Telephone Encounter (Signed)
She understands, will try with Tylenol, and alternate the Ibuprofen, use Ice / heat as well  She wants to know if you will refill the Tramadol

## 2018-08-07 NOTE — Therapy (Signed)
[facility] [place], [place], [address] Phone: [phone]   Fax:  [phone]  Occupational Therapy Evaluation  Patient Details  Name: Ann Ortega MRN: [id number] Date of Birth: Oct 22, 1945 Referring Provider (OT): Arther Abbott   Encounter Date: 08/06/2018  OT End of Session - 08/07/18 1042    Visit Number  1    Number of Visits  12    Date for OT Re-Evaluation  09/18/18    Authorization Type  Medicare Part A & B; [employer]    OT Start Time  7622    OT Stop Time  1820    OT Time Calculation (min)  45 min    Activity Tolerance  Patient tolerated treatment well;Patient limited by pain    Behavior During Therapy  [facility] for tasks assessed/performed       Past Medical History:  Diagnosis Date  . Thyroid disease     Past Surgical History:  Procedure Laterality Date  . BREAST EXCISIONAL BIOPSY Left     There were no vitals filed for this visit.  Subjective Assessment - 08/07/18 1036    Subjective   S: I went to the Doctor and he said I tore the rotator cuff in 3 places. Now he wants to me do therapy on that.     Pertinent History  Patient is a 73 y/i female that has been attending OT services at this clinic for her right shoulder pain. Recently, patient sustained a mechanical fall which resulted in a complete supraspinatus tendon tear, extending to the infraspinatus, and a complete tear of the subscapularis of her left shoulder. Pt was discharged from therapy for her right shoulder and will transition over to focusing on her left shoulder.     Patient Stated Goals  To increase ROM and strength prior to surgery.    Currently in Pain?  Yes    Pain Score  9     Pain Location  Shoulder    Pain Orientation  Left    Pain Descriptors / Indicators  Aching;Sore    Pain Type  Acute pain    Pain Radiating Towards  N/A    Pain Onset  1 to 4 weeks ago    Pain Frequency  Constant    Aggravating Factors   movement and use    Pain  Relieving Factors  nothing    Effect of Pain on Daily Activities  sever effect    Multiple Pain Sites  No        [facility] OT Assessment - 08/06/18 1741      Assessment   Medical Diagnosis  left Shoulder pain    Referring Provider (OT)  Arther Abbott    Onset Date/Surgical Date  07/07/18    Hand Dominance  Right    Next MD Visit  08/22/18    Prior Therapy  Patient was receiving therapy for her Right shoulder only.       Precautions   Precautions  None      Restrictions   Weight Bearing Restrictions  No      Balance Screen   Has the patient fallen in the past 6 months  Yes    How many times?  1    Has the patient had a decrease in activity level because of a fear of falling?   No    Is the patient reluctant to leave their home because of a fear of falling?   No  Home  Environment   Family/patient expects to be discharged to:  Private residence    Lives With  Spouse      Prior Function   Level of [place]  Retired    Leisure  Management consultant, listening to music      ADL   ADL comments  Patient is unable lift her LUE to her shoulder level. Able to complete lifting and carrying activities at waist level.       Mobility   Mobility Status  Independent      Written Expression   Dominant Hand  Left      Vision - History   Baseline Vision  Wears glasses all the time      Palpation   Palpation comment  Max fascial restrictions in the left upper arm, trapezius. and scapularis region.       AROM   Overall AROM Comments  Assessed seated, er/IR adducted    AROM Assessment Site  Shoulder    Right/Left Shoulder  Left    Left Shoulder Flexion  95 Degrees    Left Shoulder ABduction  84 Degrees    Left Shoulder Internal Rotation  90 Degrees    Left Shoulder External Rotation  39 Degrees      PROM   Overall PROM Comments  Assessed supine, er/IR adducted    PROM Assessment Site  Shoulder    Right/Left Shoulder  Left    Left Shoulder Flexion   105 Degrees    Left Shoulder ABduction  87 Degrees    Left Shoulder Internal Rotation  90 Degrees    Left Shoulder External Rotation  53 Degrees      Strength   Overall Strength Comments  Assessed seated, er/IR adducted    Strength Assessment Site  Shoulder    Right/Left Shoulder  Left    Left Shoulder Flexion  3-/5    Left Shoulder ABduction  3-/5    Left Shoulder Internal Rotation  3/5    Left Shoulder External Rotation  3-/5               OT Treatments/Exercises (OP) - 08/06/18 1040      Exercises   Exercises  Shoulder      Manual Therapy   Manual Therapy  Myofascial release    Manual therapy comments  manual therapy completed prior to exercises.     Myofascial Release  Myofascial release and manual stretching completed to left upper arm, trapezius, and scapularis region to decrease fascial restrictions and increase joint mobility in a pain zone.             OT Education - 08/07/18 1041    Education Details  For RUE: shoulder strengthening. For LUE: table slides and pendulums.    Person(s) Educated  Patient    Methods  Explanation;Demonstration;Verbal cues;Handout    Comprehension  Returned demonstration;Verbalized understanding       OT Short Term Goals - 08/07/18 1045      OT SHORT TERM GOAL #1   Title  Pt will be provided with and educated on HEP to improve ability to use LUE during ADLs with increased comfort    Time  6    Period  Weeks    Status  New    Target Date  09/18/18      OT SHORT TERM GOAL #2   Title  Patient will report a decrease in LUE pain of approximately 4/10 while completing daily  tasks.     Time  6    Period  Weeks    Status  New      OT SHORT TERM GOAL #3   Title  Patient will increase P/ROM of LUE to [facility] in order to increase ability to complete upper body dressing tasks.     Time  6    Period  Weeks    Status  New      OT SHORT TERM GOAL #4   Title  Patient will decrease LUE fascial restrictions to moderate amount in  order to increase functional mobility needed to complete reaching tasks below shoulder level.     Time  6    Period  Weeks    Status  New      OT SHORT TERM GOAL #5   Title  Patient increase LUE A/ROM by 10 degrees in order to increase ability to get shirts on and off with less difficulty.     Time  6    Period  Weeks    Status  New               Plan - 08/07/18 1043    Clinical Impression Statement  A: Patient is a 73 y/o female S/P left shoulder RTC tear causing increased pain, fascial restrictions, and decreased ROM and strength resulting in difficulty and inability to complete basic daily tasks using her LUE.    Occupational Profile and client history currently impacting functional performance  Patient is motivated to do well in therapy.     Occupational performance deficits (Please refer to evaluation for details):  ADL's;IADL's;Rest and Sleep;Leisure    Rehab Potential  Good    Current Impairments/barriers affecting progress:  Increased pain, extent of tears.    OT Frequency  2x / week    OT Duration  6 weeks    OT Treatment/Interventions  Self-care/ADL training;Therapeutic exercise;Ultrasound;Manual Therapy;Therapeutic activities;Cryotherapy;Electrical Stimulation;Moist Heat;Passive range of motion;Patient/family education;Neuromuscular education;DME and/or AE instruction    Plan  P: Patient will benefit from skilled OT services to increase functional performance and allow her to complete basic daily tasks using her LUE. Treatment Plan: myofascial release, manual stretching, P/ROM, AA/ROM, A/ROM, and general strengthening. Modalities PRN.     Clinical Decision Making  Several treatment options, min-mod task modification necessary    Consulted and Agree with Plan of Care  Patient       Patient will benefit from skilled therapeutic intervention in order to improve the following deficits and impairments:  Decreased activity tolerance, Decreased strength, Impaired flexibility,  Decreased range of motion, Pain, Increased fascial restrictions, Impaired UE functional use  Visit Diagnosis: Other symptoms and signs involving the musculoskeletal system - Plan: Ot plan of care cert/re-cert  Acute pain of left shoulder - Plan: Ot plan of care cert/re-cert  Stiffness of left shoulder, not elsewhere classified - Plan: Ot plan of care cert/re-cert    Problem List Patient Active Problem List   Diagnosis Date Noted  . PATELLAR DISLOCATION, LEFT 02/13/2010  . TEAR M C L 01/09/2010  . MEDIAL MENISCUS TEAR, LEFT 01/02/2010   Ailene Ravel, OTR/L,CBIS  [phone]  08/07/2018, 11:52 AM  [facility] [address] [place], [place], [address] Phone: [phone]   Fax:  [phone]  Name: Ann Ortega MRN: [id number] Date of Birth: 19-Jul-1946

## 2018-08-08 ENCOUNTER — Encounter (HOSPITAL_COMMUNITY): Payer: Self-pay

## 2018-08-08 ENCOUNTER — Ambulatory Visit (HOSPITAL_COMMUNITY): Payer: Medicare Other

## 2018-08-08 DIAGNOSIS — R29898 Other symptoms and signs involving the musculoskeletal system: Secondary | ICD-10-CM

## 2018-08-08 DIAGNOSIS — M25512 Pain in left shoulder: Secondary | ICD-10-CM

## 2018-08-08 DIAGNOSIS — M25612 Stiffness of left shoulder, not elsewhere classified: Secondary | ICD-10-CM

## 2018-08-08 MED ORDER — TRAMADOL HCL 50 MG PO TABS: 50.0000 mg | ORAL_TABLET | Freq: Four times a day (QID) | ORAL | 0 refills | Status: DC | PRN

## 2018-08-08 NOTE — Patient Instructions (Signed)
Complete each exercise 3 times. Hold for 5 seconds each.   Shoulder Isometrics- Flexion  Standing up, place a towel between your arm/wrist and the wall. Press your arm forward into the wall gently, without moving your shoulder.      Shoulder Isometrics- Extension  Standing with your back against a wall. Bend elbow to 90 degrees and place a towel between your bent elbow and the wall.  Press your elbow back into the wall gently, without moving your shoulder.      Shoulder Isometrics- Abduction  Standing to the side of a wall, place a towel between your arm and the wall.  Press your arm into the wall gently.  Do not move your shoulder.

## 2018-08-08 NOTE — Therapy (Signed)
[facility] [place], [place], [address] Phone: [phone]   Fax:  [phone]  Occupational Therapy Treatment  Patient Details  Name: Ann Ortega MRN: [id number] Date of Birth: 06-14-1946 Referring Provider (OT): Arther Abbott   Encounter Date: 08/08/2018  OT End of Session - 08/08/18 1511    Visit Number  2    Number of Visits  12    Date for OT Re-Evaluation  09/18/18    Authorization Type  Medicare Part A & B; [employer]    OT Start Time  1440    OT Stop Time  1515    OT Time Calculation (min)  35 min    Activity Tolerance  Patient tolerated treatment well;Patient limited by pain    Behavior During Therapy  [facility] for tasks assessed/performed       Past Medical History:  Diagnosis Date  . Thyroid disease     Past Surgical History:  Procedure Laterality Date  . BREAST EXCISIONAL BIOPSY Left     There were no vitals filed for this visit.  Subjective Assessment - 08/08/18 1458    Subjective   S: I never know how to rate my pain.    Currently in Pain?  Yes    Pain Score  8     Pain Location  Shoulder    Pain Orientation  Left    Pain Descriptors / Indicators  Aching;Sore    Pain Type  Acute pain         [facility] OT Assessment - 08/08/18 1459      Assessment   Medical Diagnosis  left Shoulder pain      Precautions   Precautions  None               OT Treatments/Exercises (OP) - 08/08/18 1459      Exercises   Exercises  Shoulder      Shoulder Exercises: Supine   Protraction  PROM;5 reps    Horizontal ABduction  PROM;5 reps    External Rotation  PROM;5 reps    Internal Rotation  PROM;5 reps    Flexion  PROM;5 reps    ABduction  PROM;5 reps      Shoulder Exercises: Seated   Extension  AROM;10 reps    Row  AROM;10 reps      Shoulder Exercises: Therapy Ball   Flexion  10 reps    ABduction  10 reps      Shoulder Exercises: ROM/Strengthening   Thumb Tacks  1' low level    Prot/Ret//Elev/Dep  1'      Shoulder Exercises: Isometric Strengthening   Flexion  Supine;3X3"    Extension  Supine;3X3"    External Rotation  Supine;3X3"    Internal Rotation  Supine;3X3"    ABduction  Supine;3X3"    ADduction  Supine;3X3"      Manual Therapy   Manual Therapy  Myofascial release    Manual therapy comments  manual therapy completed prior to exercises.     Myofascial Release  Myofascial release and manual stretching completed to left upper arm, trapezius, and scapularis region to decrease fascial restrictions and increase joint mobility in a pain zone.              OT Education - 08/08/18 1517    Education Details  shoulder isometric strengthening (LUE): flexion, extension, abduction    Person(s) Educated  Patient    Methods  Explanation;Demonstration;Verbal cues;Handout    Comprehension  Verbalized  understanding       OT Short Term Goals - 08/08/18 1518      OT SHORT TERM GOAL #1   Title  Pt will be provided with and educated on HEP to improve ability to use LUE during ADLs with increased comfort    Time  6    Period  Weeks    Status  On-going      OT SHORT TERM GOAL #2   Title  Patient will report a decrease in LUE pain of approximately 4/10 while completing daily tasks.     Time  6    Period  Weeks    Status  On-going      OT SHORT TERM GOAL #3   Title  Patient will increase P/ROM of LUE to [facility] in order to increase ability to complete upper body dressing tasks.     Time  6    Period  Weeks    Status  On-going      OT SHORT TERM GOAL #4   Title  Patient will decrease LUE fascial restrictions to moderate amount in order to increase functional mobility needed to complete reaching tasks below shoulder level.     Time  6    Period  Weeks    Status  On-going      OT SHORT TERM GOAL #5   Title  Patient increase LUE A/ROM by 10 degrees in order to increase ability to get shirts on and off with less difficulty.     Time  6    Period  Weeks     Status  On-going               Plan - 08/08/18 1511    Clinical Impression Statement  A: Initiated myofascial release, manual stretching, isometric shoulder strengthening, therapy ball stretching. Added some isometrics to HEP per patient's request. Patient presents with less restrictions from evaluation in left UE. manual techniques completed to address. VC for form and technique.     Plan  P: Add PVC pipe slide if able to tolerate.     Consulted and Agree with Plan of Care  Patient       Patient will benefit from skilled therapeutic intervention in order to improve the following deficits and impairments:  Decreased activity tolerance, Decreased strength, Impaired flexibility, Decreased range of motion, Pain, Increased fascial restrictions, Impaired UE functional use  Visit Diagnosis: Other symptoms and signs involving the musculoskeletal system  Acute pain of left shoulder  Stiffness of left shoulder, not elsewhere classified    Problem List Patient Active Problem List   Diagnosis Date Noted  . PATELLAR DISLOCATION, LEFT 02/13/2010  . TEAR M C L 01/09/2010  . MEDIAL MENISCUS TEAR, LEFT 01/02/2010   Ailene Ravel, OTR/L,CBIS  [phone]  08/08/2018, 4:05 PM  [facility] [address] [place], [place], [address] Phone: [phone]   Fax:  [phone]  Name: Ann Ortega MRN: [id number] Date of Birth: 05-12-1946

## 2018-08-11 ENCOUNTER — Telehealth: Payer: Self-pay | Admitting: Orthopedic Surgery

## 2018-08-11 NOTE — Telephone Encounter (Signed)
NO SHE HAS TO WAIT UNTIL THE MEDICINE COMES IN

## 2018-08-11 NOTE — Telephone Encounter (Signed)
Ann Ortega called stating that Dr. Aline Brochure called in medicine for her on Friday but that when she went to get it, she was told at [facility] that this medication is on back order  And they didn't know when they would get it in.  She wants to know if there is something else that can be sent in for her to [place]?  Thanks

## 2018-08-13 ENCOUNTER — Encounter (HOSPITAL_COMMUNITY): Payer: Self-pay | Admitting: Occupational Therapy

## 2018-08-13 ENCOUNTER — Ambulatory Visit (HOSPITAL_COMMUNITY): Payer: Medicare Other | Admitting: Occupational Therapy

## 2018-08-13 DIAGNOSIS — M25512 Pain in left shoulder: Secondary | ICD-10-CM

## 2018-08-13 DIAGNOSIS — R29898 Other symptoms and signs involving the musculoskeletal system: Secondary | ICD-10-CM | POA: Diagnosis not present

## 2018-08-13 DIAGNOSIS — M25612 Stiffness of left shoulder, not elsewhere classified: Secondary | ICD-10-CM

## 2018-08-13 NOTE — Therapy (Signed)
[place], [place], [address] Phone: [phone]   Fax:  [phone]  Occupational Therapy Treatment  Patient Details  Name: CALENA SALEM MRN: [id number] Date of Birth: 06-Apr-1946 Referring Provider (OT): Arther Abbott   Encounter Date: 08/13/2018  OT End of Session - 08/13/18 1202    Visit Number  3    Number of Visits  12    Date for OT Re-Evaluation  09/18/18    Authorization Type  Medicare Part A & B; [employer]    OT Start Time  1119    OT Stop Time  1200    OT Time Calculation (min)  41 min    Activity Tolerance  Patient tolerated treatment well;Patient limited by pain    Behavior During Therapy  [facility] for tasks assessed/performed       Past Medical History:  Diagnosis Date  . Thyroid disease     Past Surgical History:  Procedure Laterality Date  . BREAST EXCISIONAL BIOPSY Left     There were no vitals filed for this visit.  Subjective Assessment - 08/13/18 1118    Subjective   S: I was trying on clothes yesterday and it was hurting last night.     Currently in Pain?  Yes    Pain Score  7     Pain Location  Shoulder    Pain Orientation  Left    Pain Descriptors / Indicators  Aching;Sore    Pain Type  Acute pain    Pain Radiating Towards  N/A    Pain Onset  1 to 4 weeks ago    Pain Frequency  Constant    Aggravating Factors   movement and use    Pain Relieving Factors  heat, ice, pain medication    Effect of Pain on Daily Activities  severe effect on use of LUE during ADLs    Multiple Pain Sites  No         [facility] OT Assessment - 08/13/18 1117      Assessment   Medical Diagnosis  left Shoulder pain      Precautions   Precautions  None               OT Treatments/Exercises (OP) - 08/13/18 1121      Exercises   Exercises  Shoulder      Shoulder Exercises: Supine   Protraction  PROM;AAROM;5 reps    Horizontal ABduction  PROM;AAROM;5 reps    External Rotation  PROM;AAROM;5  reps    Internal Rotation  PROM;AAROM;5 reps    Flexion  PROM;AAROM;5 reps    ABduction  PROM;AAROM;5 reps      Shoulder Exercises: Seated   Extension  AROM;10 reps    Row  AROM;10 reps      Shoulder Exercises: Therapy Ball   Flexion  10 reps    ABduction  10 reps      Shoulder Exercises: ROM/Strengthening   Wall Wash  1'    Thumb Tacks  1' low level    Prot/Ret//Elev/Dep  1'    Other ROM/Strengthening Exercises  PVC pipe slide, 10X flexion      Shoulder Exercises: Isometric Strengthening   Flexion  Supine;3X5"    Extension  Supine;3X5"    External Rotation  Supine;3X5"    Internal Rotation  Supine;3X5"    ABduction  Supine;3X5"    ADduction  Supine;3X5"      Manual Therapy   Manual Therapy  Myofascial  release    Manual therapy comments  manual therapy completed prior to exercises.     Myofascial Release  Myofascial release and manual stretching completed to left upper arm, trapezius, and scapularis region to decrease fascial restrictions and increase joint mobility in a pain zone.                OT Short Term Goals - 08/08/18 1518      OT SHORT TERM GOAL #1   Title  Pt will be provided with and educated on HEP to improve ability to use LUE during ADLs with increased comfort    Time  6    Period  Weeks    Status  On-going      OT SHORT TERM GOAL #2   Title  Patient will report a decrease in LUE pain of approximately 4/10 while completing daily tasks.     Time  6    Period  Weeks    Status  On-going      OT SHORT TERM GOAL #3   Title  Patient will increase P/ROM of LUE to [facility] in order to increase ability to complete upper body dressing tasks.     Time  6    Period  Weeks    Status  On-going      OT SHORT TERM GOAL #4   Title  Patient will decrease LUE fascial restrictions to moderate amount in order to increase functional mobility needed to complete reaching tasks below shoulder level.     Time  6    Period  Weeks    Status  On-going      OT SHORT  TERM GOAL #5   Title  Patient increase LUE A/ROM by 10 degrees in order to increase ability to get shirts on and off with less difficulty.     Time  6    Period  Weeks    Status  On-going               Plan - 08/13/18 1158    Clinical Impression Statement  A: Continued with manual techniques to address fascial restrictions in LUE limiting ROM and causing increased pain. Continued with isometrics increasing to 3x5", added supine AA/ROM to tolerance, wall wash, and pvc pipe slide. Pt reporting improvement in stiffness during exercises. Pt requiring verbal cuing for form and technique, moderate fatigue at end of session.     Plan  P: Continued with AA/ROM increasing repetitions to 10, add pulleys       Patient will benefit from skilled therapeutic intervention in order to improve the following deficits and impairments:  Decreased activity tolerance, Decreased strength, Impaired flexibility, Decreased range of motion, Pain, Increased fascial restrictions, Impaired UE functional use  Visit Diagnosis: Other symptoms and signs involving the musculoskeletal system  Acute pain of left shoulder  Stiffness of left shoulder, not elsewhere classified    Problem List Patient Active Problem List   Diagnosis Date Noted  . PATELLAR DISLOCATION, LEFT 02/13/2010  . TEAR M C L 01/09/2010  . MEDIAL MENISCUS TEAR, LEFT 01/02/2010   Guadelupe Sabin, OTR/L  [phone] 08/13/2018, 12:04 PM  [place] [address] [place], [place], [address] Phone: [phone]   Fax:  [phone]  Name: ARMIYAH CAPRON MRN: [id number] Date of Birth: Aug 14, 1945

## 2018-08-13 NOTE — Telephone Encounter (Signed)
Patient called back - I have relayed the information as noted per Dr Aline Brochure. Patient voiced understanding.

## 2018-08-15 ENCOUNTER — Encounter (HOSPITAL_COMMUNITY): Payer: Self-pay | Admitting: Occupational Therapy

## 2018-08-15 ENCOUNTER — Ambulatory Visit (HOSPITAL_COMMUNITY): Payer: Medicare Other | Admitting: Occupational Therapy

## 2018-08-15 DIAGNOSIS — R29898 Other symptoms and signs involving the musculoskeletal system: Secondary | ICD-10-CM | POA: Diagnosis not present

## 2018-08-15 DIAGNOSIS — M25512 Pain in left shoulder: Secondary | ICD-10-CM | POA: Diagnosis not present

## 2018-08-15 DIAGNOSIS — M25612 Stiffness of left shoulder, not elsewhere classified: Secondary | ICD-10-CM

## 2018-08-15 NOTE — Therapy (Signed)
[place], [place], [address] Phone: [phone]   Fax:  [phone]  Occupational Therapy Treatment  Patient Details  Name: Ann Ortega MRN: [id number] Date of Birth: 13-Jun-1946 Referring Provider (OT): Arther Abbott   Encounter Date: 08/15/2018  OT End of Session - 08/15/18 1201    Visit Number  4    Number of Visits  12    Date for OT Re-Evaluation  09/18/18    Authorization Type  Medicare Part A & B; [employer]    OT Start Time  1121    OT Stop Time  1200    OT Time Calculation (min)  39 min    Activity Tolerance  Patient tolerated treatment well;Patient limited by pain    Behavior During Therapy  [facility] for tasks assessed/performed       Past Medical History:  Diagnosis Date  . Thyroid disease     Past Surgical History:  Procedure Laterality Date  . BREAST EXCISIONAL BIOPSY Left     There were no vitals filed for this visit.  Subjective Assessment - 08/15/18 1122    Subjective   S: I slept at the foot of the bed last night and was able to sleep.     Currently in Pain?  Yes    Pain Score  6     Pain Location  Shoulder    Pain Orientation  Left    Pain Descriptors / Indicators  Aching;Sore    Pain Type  Acute pain    Pain Radiating Towards  N/A    Pain Onset  1 to 4 weeks ago    Pain Frequency  Constant    Aggravating Factors   movement and use    Pain Relieving Factors  heat, ice, pain medication    Effect of Pain on Daily Activities  severe effect of use of LUE during ADLs    Multiple Pain Sites  No         [facility] OT Assessment - 08/15/18 1122      Assessment   Medical Diagnosis  left Shoulder pain      Precautions   Precautions  None               OT Treatments/Exercises (OP) - 08/15/18 1123      Exercises   Exercises  Shoulder      Shoulder Exercises: Supine   Protraction  PROM;5 reps;AAROM;10 reps    Horizontal ABduction  PROM;5 reps;AAROM;10 reps    External  Rotation  PROM;5 reps;AAROM;10 reps    Internal Rotation  PROM;5 reps;AAROM;10 reps    Flexion  PROM;5 reps;AAROM;10 reps    ABduction  PROM;5 reps;AAROM;10 reps      Shoulder Exercises: Seated   Extension  AROM;10 reps    Row  AROM;10 reps    Protraction  AAROM;5 reps    External Rotation  AAROM;5 reps    Internal Rotation  AAROM;5 reps    Flexion  AAROM;5 reps    Abduction  AAROM;5 reps      Shoulder Exercises: Pulleys   Flexion  1 minute    ABduction  1 minute      Shoulder Exercises: ROM/Strengthening   Wall Wash  1'    Thumb Tacks  1' low level    Prot/Ret//Elev/Dep  1'    Other ROM/Strengthening Exercises  PVC pipe slide, 10X flexion      Manual Therapy   Manual Therapy  Myofascial release  Manual therapy comments  manual therapy completed prior to exercises.     Myofascial Release  Myofascial release and manual stretching completed to left upper arm, trapezius, and scapularis region to decrease fascial restrictions and increase joint mobility in a pain zone.                OT Short Term Goals - 08/08/18 1518      OT SHORT TERM GOAL #1   Title  Pt will be provided with and educated on HEP to improve ability to use LUE during ADLs with increased comfort    Time  6    Period  Weeks    Status  On-going      OT SHORT TERM GOAL #2   Title  Patient will report a decrease in LUE pain of approximately 4/10 while completing daily tasks.     Time  6    Period  Weeks    Status  On-going      OT SHORT TERM GOAL #3   Title  Patient will increase P/ROM of LUE to [facility] in order to increase ability to complete upper body dressing tasks.     Time  6    Period  Weeks    Status  On-going      OT SHORT TERM GOAL #4   Title  Patient will decrease LUE fascial restrictions to moderate amount in order to increase functional mobility needed to complete reaching tasks below shoulder level.     Time  6    Period  Weeks    Status  On-going      OT SHORT TERM GOAL #5    Title  Patient increase LUE A/ROM by 10 degrees in order to increase ability to get shirts on and off with less difficulty.     Time  6    Period  Weeks    Status  On-going               Plan - 08/15/18 1137    Clinical Impression Statement  A: Pt reports arm felt looser and less painful after last session. Continued with manual therapy to LUE to address fascial restrictions limiting ROM. Continued with AA/ROM and increased repetitions to 10, added AA/ROM in sitting and pulley exercises. Rest breaks provided as needed during session for fatigue. Verbal cuing for form and technique during session.     Plan  P: Increase AA/ROM reps to 10 in sitting, add therapy ball circles working on IR       Patient will benefit from skilled therapeutic intervention in order to improve the following deficits and impairments:  Decreased activity tolerance, Decreased strength, Impaired flexibility, Decreased range of motion, Pain, Increased fascial restrictions, Impaired UE functional use  Visit Diagnosis: Other symptoms and signs involving the musculoskeletal system  Acute pain of left shoulder  Stiffness of left shoulder, not elsewhere classified    Problem List Patient Active Problem List   Diagnosis Date Noted  . PATELLAR DISLOCATION, LEFT 02/13/2010  . TEAR M C L 01/09/2010  . MEDIAL MENISCUS TEAR, LEFT 01/02/2010   Guadelupe Sabin, OTR/L  [phone] 08/15/2018, 12:02 PM  [place] [address] [place], [place], [address] Phone: [phone]   Fax:  [phone]  Name: Ann Ortega MRN: [id number] Date of Birth: 10-10-1945

## 2018-08-20 ENCOUNTER — Ambulatory Visit (HOSPITAL_COMMUNITY): Payer: Medicare Other | Admitting: Occupational Therapy

## 2018-08-20 ENCOUNTER — Encounter (HOSPITAL_COMMUNITY): Payer: Self-pay | Admitting: Occupational Therapy

## 2018-08-20 DIAGNOSIS — M25512 Pain in left shoulder: Secondary | ICD-10-CM

## 2018-08-20 DIAGNOSIS — M25612 Stiffness of left shoulder, not elsewhere classified: Secondary | ICD-10-CM | POA: Diagnosis not present

## 2018-08-20 DIAGNOSIS — R29898 Other symptoms and signs involving the musculoskeletal system: Secondary | ICD-10-CM

## 2018-08-20 NOTE — Therapy (Signed)
[place], [place], [address] Phone: [phone]   Fax:  [phone]  Occupational Therapy Treatment  Patient Details  Name: Ann Ortega MRN: [id number] Date of Birth: 1945/08/11 Referring Provider (OT): Arther Abbott   Encounter Date: 08/20/2018  OT End of Session - 08/20/18 1204    Visit Number  5    Number of Visits  12    Date for OT Re-Evaluation  09/18/18    Authorization Type  Medicare Part A & B; [employer]    OT Start Time  1120    OT Stop Time  1204    OT Time Calculation (min)  44 min    Activity Tolerance  Patient tolerated treatment well;Patient limited by pain    Behavior During Therapy  [facility] for tasks assessed/performed       Past Medical History:  Diagnosis Date  . Thyroid disease     Past Surgical History:  Procedure Laterality Date  . BREAST EXCISIONAL BIOPSY Left     There were no vitals filed for this visit.  Subjective Assessment - 08/20/18 1120    Subjective   S: I had a rough night last night.     Currently in Pain?  Yes    Pain Score  7     Pain Location  Shoulder    Pain Orientation  Left    Pain Descriptors / Indicators  Aching;Sore    Pain Type  Acute pain    Pain Radiating Towards  N/A    Pain Onset  1 to 4 weeks ago    Pain Frequency  Constant    Aggravating Factors   movement and use    Pain Relieving Factors  heat, ice, pain medication    Effect of Pain on Daily Activities  severe effect of use of LUE during ADLs    Multiple Pain Sites  No         [facility] OT Assessment - 08/20/18 1120      Assessment   Medical Diagnosis  left Shoulder pain      Precautions   Precautions  None               OT Treatments/Exercises (OP) - 08/20/18 1123      Exercises   Exercises  Shoulder      Shoulder Exercises: Supine   Protraction  PROM;5 reps;AAROM;10 reps    Horizontal ABduction  PROM;5 reps;AAROM;10 reps    External Rotation  PROM;5 reps;AAROM;10 reps    Internal Rotation  PROM;5 reps;AAROM;10 reps    Flexion  PROM;5 reps;AAROM;10 reps    ABduction  PROM;5 reps;AAROM;10 reps      Shoulder Exercises: Seated   Protraction  AAROM;10 reps    Horizontal ABduction  AAROM;5 reps    External Rotation  AAROM;10 reps    Internal Rotation  AAROM;10 reps    Flexion  AAROM;10 reps    Abduction  AAROM;10 reps      Shoulder Exercises: Therapy Ball   Right/Left  5 reps   each direction     Shoulder Exercises: ROM/Strengthening   Wall Wash  1'      Manual Therapy   Manual Therapy  Myofascial release    Manual therapy comments  manual therapy completed prior to exercises.     Myofascial Release  Myofascial release and manual stretching completed to left upper arm, trapezius, and scapularis region to decrease fascial restrictions and increase joint mobility in a pain  zone.              OT Education - 08/20/18 1144    Education Details  AA/ROM exercises supine and standing for LUE    Person(s) Educated  Patient    Methods  Explanation;Demonstration;Handout    Comprehension  Verbalized understanding;Returned demonstration       OT Short Term Goals - 08/08/18 1518      OT SHORT TERM GOAL #1   Title  Pt will be provided with and educated on HEP to improve ability to use LUE during ADLs with increased comfort    Time  6    Period  Weeks    Status  On-going      OT SHORT TERM GOAL #2   Title  Patient will report a decrease in LUE pain of approximately 4/10 while completing daily tasks.     Time  6    Period  Weeks    Status  On-going      OT SHORT TERM GOAL #3   Title  Patient will increase P/ROM of LUE to [facility] in order to increase ability to complete upper body dressing tasks.     Time  6    Period  Weeks    Status  On-going      OT SHORT TERM GOAL #4   Title  Patient will decrease LUE fascial restrictions to moderate amount in order to increase functional mobility needed to complete reaching tasks below shoulder level.     Time   6    Period  Weeks    Status  On-going      OT SHORT TERM GOAL #5   Title  Patient increase LUE A/ROM by 10 degrees in order to increase ability to get shirts on and off with less difficulty.     Time  6    Period  Weeks    Status  On-going               Plan - 08/20/18 1137    Clinical Impression Statement  A: Pt reports trying to roll her hair last night and had increased pain afterwards. Continued with manual therapy to LUE and scapular regions, very tender along lateral border of scapula. Pt with ROM WFL during P/ROM, continued with AA/ROM this session, pt with improvement in tolerance to exercises today, able to add horizontal abduction in sitting. Also added therapy ball circles for improved IR. Verbal cuing for form and technique during exercises.     Plan  P: Add proximal shoulder strengthening in supine, increase AA/ROM repetitions to 12 in supine. Follow up on HEP       Patient will benefit from skilled therapeutic intervention in order to improve the following deficits and impairments:  Decreased activity tolerance, Decreased strength, Impaired flexibility, Decreased range of motion, Pain, Increased fascial restrictions, Impaired UE functional use  Visit Diagnosis: Other symptoms and signs involving the musculoskeletal system  Acute pain of left shoulder  Stiffness of left shoulder, not elsewhere classified    Problem List Patient Active Problem List   Diagnosis Date Noted  . PATELLAR DISLOCATION, LEFT 02/13/2010  . TEAR M C L 01/09/2010  . MEDIAL MENISCUS TEAR, LEFT 01/02/2010   Guadelupe Sabin, OTR/L  [phone] 08/20/2018, 12:06 PM  [place] [address] [place], [place], [address] Phone: [phone]   Fax:  [phone]  Name: Ann Ortega MRN: [id number] Date of Birth: 1945-09-19

## 2018-08-20 NOTE — Patient Instructions (Signed)
Perform each exercise ____10____ reps. 2-3x days. Complete lying on your back and while sitting or standing.   Protraction  Start by holding a wand or cane at chest height.  Next, slowly push the wand outwards in front of your body so that your elbows become fully straightened. Then, return to the original position.     Shoulder FLEXION   In the standing position, hold a wand/cane with both arms, palms down on both sides. Raise up the wand/cane allowing your unaffected arm to perform most of the effort. Your affected arm should be partially relaxed.      Internal/External ROTATION   In the standing position, hold a wand/cane with both hands keeping your elbows bent. Move your arms and wand/cane to one side.  Your affected arm should be partially relaxed while your unaffected arm performs most of the effort.       Shoulder ABDUCTION  While holding a wand/cane palm face up on the injured side and palm face down on the uninjured side, slowly raise up your injured arm to the side.                     Horizontal Abduction/Adduction      Straight arms holding cane at shoulder height, bring cane to right, center, left. Repeat starting to left.   Copyright  [employer]. All rights reserved.

## 2018-08-22 ENCOUNTER — Encounter (HOSPITAL_COMMUNITY): Payer: Self-pay | Admitting: Occupational Therapy

## 2018-08-22 ENCOUNTER — Ambulatory Visit (HOSPITAL_COMMUNITY): Payer: Medicare Other | Admitting: Occupational Therapy

## 2018-08-22 DIAGNOSIS — M25512 Pain in left shoulder: Secondary | ICD-10-CM

## 2018-08-22 DIAGNOSIS — R29898 Other symptoms and signs involving the musculoskeletal system: Secondary | ICD-10-CM

## 2018-08-22 DIAGNOSIS — M25612 Stiffness of left shoulder, not elsewhere classified: Secondary | ICD-10-CM

## 2018-08-22 NOTE — Therapy (Signed)
[place], [place], [address] Phone: [phone]   Fax:  [phone]  Occupational Therapy Treatment  Patient Details  Name: Ann Ortega MRN: [id number] Date of Birth: [birth year] Referring Provider (OT): Arther Abbott   Encounter Date: 08/22/2018  OT End of Session - 08/22/18 1200    Visit Number  6    Number of Visits  12    Date for OT Re-Evaluation  09/18/18    Authorization Type  Medicare Part A & B; [employer]    OT Start Time  1118    OT Stop Time  1200    OT Time Calculation (min)  42 min    Activity Tolerance  Patient tolerated treatment well;Patient limited by pain    Behavior During Therapy  [facility] for tasks assessed/performed       Past Medical History:  Diagnosis Date  . Thyroid disease     Past Surgical History:  Procedure Laterality Date  . BREAST EXCISIONAL BIOPSY Left     There were no vitals filed for this visit.  Subjective Assessment - 08/22/18 1118    Subjective   S: I took a pill last night.     Currently in Pain?  Yes    Pain Score  6     Pain Location  Shoulder    Pain Orientation  Left    Pain Descriptors / Indicators  Aching;Sore    Pain Type  Acute pain    Pain Radiating Towards  N/A    Pain Onset  1 to 4 weeks ago    Pain Frequency  Constant    Aggravating Factors   movement and use    Pain Relieving Factors  heat, ice, pain medication    Effect of Pain on Daily Activities  severe effect of use of LUE during ADLs    Multiple Pain Sites  No         [facility] OT Assessment - 08/22/18 1117      Assessment   Medical Diagnosis  left Shoulder pain      Precautions   Precautions  None               OT Treatments/Exercises (OP) - 08/22/18 1120      Exercises   Exercises  Shoulder      Shoulder Exercises: Supine   Protraction  PROM;5 reps;AAROM;10 reps    Horizontal ABduction  PROM;5 reps;AAROM;10 reps    External Rotation  PROM;5 reps;AAROM;10 reps    Internal Rotation  PROM;5 reps;AAROM;10 reps    Flexion  PROM;5 reps;AAROM;10 reps    ABduction  PROM;5 reps;AAROM;10 reps      Shoulder Exercises: Seated   Protraction  AAROM;10 reps    Horizontal ABduction  AAROM;10 reps    Horizontal ABduction Limitations  1 rest breaks    External Rotation  AAROM;10 reps    Internal Rotation  AAROM;10 reps    Flexion  AAROM;10 reps    Abduction  AAROM;10 reps      Shoulder Exercises: Pulleys   Flexion  1 minute    ABduction  1 minute      Shoulder Exercises: ROM/Strengthening   Wall Wash  1'    Proximal Shoulder Strengthening, Supine  10X each 1 rest break    Other ROM/Strengthening Exercises  PVC pipe slide, 12X flexion      Manual Therapy   Manual Therapy  Myofascial release    Manual therapy comments  manual  therapy completed prior to exercises.     Myofascial Release  Myofascial release and manual stretching completed to left upper arm, trapezius, and scapularis region to decrease fascial restrictions and increase joint mobility in a pain zone.                OT Short Term Goals - 08/08/18 1518      OT SHORT TERM GOAL #1   Title  Pt will be provided with and educated on HEP to improve ability to use LUE during ADLs with increased comfort    Time  6    Period  Weeks    Status  On-going      OT SHORT TERM GOAL #2   Title  Patient will report a decrease in LUE pain of approximately 4/10 while completing daily tasks.     Time  6    Period  Weeks    Status  On-going      OT SHORT TERM GOAL #3   Title  Patient will increase P/ROM of LUE to [facility] in order to increase ability to complete upper body dressing tasks.     Time  6    Period  Weeks    Status  On-going      OT SHORT TERM GOAL #4   Title  Patient will decrease LUE fascial restrictions to moderate amount in order to increase functional mobility needed to complete reaching tasks below shoulder level.     Time  6    Period  Weeks    Status  On-going      OT SHORT  TERM GOAL #5   Title  Patient increase LUE A/ROM by 10 degrees in order to increase ability to get shirts on and off with less difficulty.     Time  6    Period  Weeks    Status  On-going               Plan - 08/22/18 1138    Clinical Impression Statement  A: Pt reports pain seems to be lessening slightly, HEP is going well. Minimal fascial restrictions palpated in trapezius and medial border of scapula, manual therapy completed to address. Pt able to tolerate P/ROM WNL today, continued with AA/ROM exercsises focusing on form and breathing. Added proximal shoulder strengthening in supine, mod difficulty with task. Verbal cuing for form and technique.     Plan  P: Increase all repetitions to 12 in supine, attempt yellow scapular theraband       Patient will benefit from skilled therapeutic intervention in order to improve the following deficits and impairments:  Decreased activity tolerance, Decreased strength, Impaired flexibility, Decreased range of motion, Pain, Increased fascial restrictions, Impaired UE functional use  Visit Diagnosis: Other symptoms and signs involving the musculoskeletal system  Acute pain of left shoulder  Stiffness of left shoulder, not elsewhere classified    Problem List Patient Active Problem List   Diagnosis Date Noted  . PATELLAR DISLOCATION, LEFT 02/13/2010  . TEAR M C L 01/09/2010  . MEDIAL MENISCUS TEAR, LEFT 01/02/2010   Guadelupe Sabin, OTR/L  [phone] 08/22/2018, 12:01 PM  [place] [address] [place], [place], [address] Phone: [phone]   Fax:  [phone]  Name: Ann Ortega MRN: [id number] Date of Birth: 01-Nov-1945

## 2018-08-27 ENCOUNTER — Ambulatory Visit (HOSPITAL_COMMUNITY): Payer: Medicare Other | Admitting: Occupational Therapy

## 2018-08-27 ENCOUNTER — Encounter (HOSPITAL_COMMUNITY): Payer: Self-pay | Admitting: Occupational Therapy

## 2018-08-27 DIAGNOSIS — M25512 Pain in left shoulder: Secondary | ICD-10-CM | POA: Diagnosis not present

## 2018-08-27 DIAGNOSIS — R29898 Other symptoms and signs involving the musculoskeletal system: Secondary | ICD-10-CM | POA: Diagnosis not present

## 2018-08-27 DIAGNOSIS — M25612 Stiffness of left shoulder, not elsewhere classified: Secondary | ICD-10-CM | POA: Diagnosis not present

## 2018-08-27 NOTE — Therapy (Signed)
[place], [place], [address] Phone: [phone]   Fax:  [phone]  Occupational Therapy Treatment  Patient Details  Name: Ann Ortega MRN: [id number] Date of Birth: 17-Oct-1945 Referring Provider (OT): Arther Abbott   Encounter Date: 08/27/2018  OT End of Session - 08/27/18 1159    Visit Number  7    Number of Visits  12    Date for OT Re-Evaluation  09/18/18    Authorization Type  Medicare Part A & B; [employer]    OT Start Time  1117    OT Stop Time  1158    OT Time Calculation (min)  41 min    Activity Tolerance  Patient tolerated treatment well;Patient limited by pain    Behavior During Therapy  [facility] for tasks assessed/performed       Past Medical History:  Diagnosis Date  . Thyroid disease     Past Surgical History:  Procedure Laterality Date  . BREAST EXCISIONAL BIOPSY Left     There were no vitals filed for this visit.  Subjective Assessment - 08/27/18 1116    Subjective   S: It was really bad last night but it's not as bad today.    Currently in Pain?  Yes    Pain Score  6     Pain Location  Shoulder    Pain Orientation  Left    Pain Descriptors / Indicators  Aching;Sore    Pain Type  Acute pain    Pain Radiating Towards  N/A    Pain Onset  1 to 4 weeks ago    Pain Frequency  Constant    Aggravating Factors   movement and use    Pain Relieving Factors  heat, ice, pain medication    Effect of Pain on Daily Activities  mod/max effect of use of LUE during ADLs.          [facility] OT Assessment - 08/27/18 1116      Assessment   Medical Diagnosis  left Shoulder pain      Precautions   Precautions  None               OT Treatments/Exercises (OP) - 08/27/18 1116      Exercises   Exercises  Shoulder      Shoulder Exercises: Supine   Protraction  PROM;5 reps;AROM;10 reps    Horizontal ABduction  PROM;5 reps;AROM;10 reps    External Rotation  PROM;5 reps;AROM;10 reps    Internal Rotation  PROM;5 reps;AROM;10 reps    Flexion  PROM;5 reps;AROM;10 reps    ABduction  PROM;5 reps;AROM;10 reps      Shoulder Exercises: Seated   Protraction  AAROM;12 reps    Horizontal ABduction  AAROM;12 reps    External Rotation  AROM;12 reps    Internal Rotation  AROM;12 reps    Flexion  AAROM;12 reps    Abduction  AAROM;12 reps      Shoulder Exercises: Standing   Extension  Theraband;10 reps    Theraband Level (Shoulder Extension)  Level 2 (Red)    Row  Theraband;10 reps    Theraband Level (Shoulder Row)  Level 2 (Red)    Retraction  Theraband;10 reps    Theraband Level (Shoulder Retraction)  Level 2 (Red)      Shoulder Exercises: Pulleys   Flexion  1 minute    ABduction  1 minute      Shoulder Exercises: ROM/Strengthening   Proximal Shoulder  Strengthening, Supine  10X each no rest breaks      Manual Therapy   Manual Therapy  Myofascial release    Manual therapy comments  manual therapy completed prior to exercises.     Myofascial Release  Myofascial release and manual stretching completed to left upper arm, trapezius, and scapularis region to decrease fascial restrictions and increase joint mobility in a pain zone.                OT Short Term Goals - 08/08/18 1518      OT SHORT TERM GOAL #1   Title  Pt will be provided with and educated on HEP to improve ability to use LUE during ADLs with increased comfort    Time  6    Period  Weeks    Status  On-going      OT SHORT TERM GOAL #2   Title  Patient will report a decrease in LUE pain of approximately 4/10 while completing daily tasks.     Time  6    Period  Weeks    Status  On-going      OT SHORT TERM GOAL #3   Title  Patient will increase P/ROM of LUE to [facility] in order to increase ability to complete upper body dressing tasks.     Time  6    Period  Weeks    Status  On-going      OT SHORT TERM GOAL #4   Title  Patient will decrease LUE fascial restrictions to moderate amount in order to  increase functional mobility needed to complete reaching tasks below shoulder level.     Time  6    Period  Weeks    Status  On-going      OT SHORT TERM GOAL #5   Title  Patient increase LUE A/ROM by 10 degrees in order to increase ability to get shirts on and off with less difficulty.     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 Manual therapy completed to address fascial restrictions in upper arm and trapezius regions. Pt able to tolerate passive stretching to [facility]. Progressed to A/ROM in supine and increased seated AA/ROM repetitions to 12. Also added red scapular theraband today, min difficulty. Verbal cuing for form and technique.     Plan  P: Continue working towards all A/ROM in sitting; update HEP for A/ROM in supine, sitting if pt performing with good form       Patient will benefit from skilled therapeutic intervention in order to improve the following deficits and impairments:  Decreased activity tolerance, Decreased strength, Impaired flexibility, Decreased range of motion, Pain, Increased fascial restrictions, Impaired UE functional use  Visit Diagnosis: Other symptoms and signs involving the musculoskeletal system  Acute pain of left shoulder  Stiffness of left shoulder, not elsewhere classified    Problem List Patient Active Problem List   Diagnosis Date Noted  . PATELLAR DISLOCATION, LEFT 02/13/2010  . TEAR M C L 01/09/2010  . MEDIAL MENISCUS TEAR, LEFT 01/02/2010   Guadelupe Sabin, OTR/L  [phone] 08/27/2018, 12:00 PM  [place] [address] [place], [place], [address] Phone: [phone]   Fax:  [phone]  Name: Ann Ortega MRN: [id number] Date of Birth: Nov 30, 1945

## 2018-08-29 ENCOUNTER — Ambulatory Visit (HOSPITAL_COMMUNITY): Payer: Medicare Other | Admitting: Occupational Therapy

## 2018-08-29 ENCOUNTER — Encounter (HOSPITAL_COMMUNITY): Payer: Self-pay | Admitting: Occupational Therapy

## 2018-08-29 DIAGNOSIS — R29898 Other symptoms and signs involving the musculoskeletal system: Secondary | ICD-10-CM

## 2018-08-29 DIAGNOSIS — M25512 Pain in left shoulder: Secondary | ICD-10-CM | POA: Diagnosis not present

## 2018-08-29 DIAGNOSIS — M25612 Stiffness of left shoulder, not elsewhere classified: Secondary | ICD-10-CM

## 2018-08-29 NOTE — Therapy (Signed)
[place], [place], [address] Phone: [phone]   Fax:  [phone]  Occupational Therapy Treatment  Patient Details  Name: Ann Ortega MRN: [id number] Date of Birth: [birth year] Referring Provider (OT): Arther Abbott   Encounter Date: 08/29/2018  OT End of Session - 08/29/18 1154    Visit Number  8    Number of Visits  12    Date for OT Re-Evaluation  09/18/18    Authorization Type  Medicare Part A & B; [employer]    OT Start Time  1116    OT Stop Time  1154    OT Time Calculation (min)  38 min    Activity Tolerance  Patient tolerated treatment well;Patient limited by pain    Behavior During Therapy  [facility] for tasks assessed/performed       Past Medical History:  Diagnosis Date  . Thyroid disease     Past Surgical History:  Procedure Laterality Date  . BREAST EXCISIONAL BIOPSY Left     There were no vitals filed for this visit.  Subjective Assessment - 08/29/18 1116    Subjective   S: I felt a whole lot better after last session.     Currently in Pain?  Yes    Pain Score  5     Pain Location  Shoulder    Pain Orientation  Left    Pain Descriptors / Indicators  Aching;Sore    Pain Type  Acute pain    Pain Radiating Towards  N/A    Pain Onset  1 to 4 weeks ago    Pain Frequency  Constant    Aggravating Factors   movement and use    Pain Relieving Factors  heat, ice, pain medication    Effect of Pain on Daily Activities  mod effect on use of LUE on ADLs    Multiple Pain Sites  No         [facility] OT Assessment - 08/29/18 1116      Assessment   Medical Diagnosis  left Shoulder pain      Precautions   Precautions  None               OT Treatments/Exercises (OP) - 08/29/18 1120      Exercises   Exercises  Shoulder      Shoulder Exercises: Supine   Protraction  PROM;5 reps;AROM;10 reps    Horizontal ABduction  PROM;5 reps;AROM;10 reps    External Rotation  PROM;5 reps;AROM;10 reps     Internal Rotation  PROM;5 reps;AROM;10 reps    Flexion  PROM;5 reps;AROM;10 reps    ABduction  PROM;5 reps;AROM;10 reps      Shoulder Exercises: Seated   Protraction  AAROM;12 reps    Horizontal ABduction  AAROM;12 reps    External Rotation  AROM;12 reps    Internal Rotation  AROM;12 reps    Flexion  AAROM;12 reps    Abduction  AAROM;12 reps      Shoulder Exercises: Standing   Extension  Theraband;10 reps    Theraband Level (Shoulder Extension)  Level 2 (Red)    Row  Theraband;10 reps    Theraband Level (Shoulder Row)  Level 2 (Red)    Retraction  Theraband;10 reps    Theraband Level (Shoulder Retraction)  Level 2 (Red)      Shoulder Exercises: ROM/Strengthening   Proximal Shoulder Strengthening, Supine  10X each no rest breaks    Other ROM/Strengthening Exercises  PVC pipe slide, 12X flexion    Other ROM/Strengthening Exercises  proximal shoulder strengthening on door, 1' flexion      Manual Therapy   Manual Therapy  Myofascial release    Manual therapy comments  manual therapy completed prior to exercises.     Myofascial Release  Myofascial release and manual stretching completed to left upper arm, trapezius, and scapularis region to decrease fascial restrictions and increase joint mobility in a pain zone.                OT Short Term Goals - 08/08/18 1518      OT SHORT TERM GOAL #1   Title  Pt will be provided with and educated on HEP to improve ability to use LUE during ADLs with increased comfort    Time  6    Period  Weeks    Status  On-going      OT SHORT TERM GOAL #2   Title  Patient will report a decrease in LUE pain of approximately 4/10 while completing daily tasks.     Time  6    Period  Weeks    Status  On-going      OT SHORT TERM GOAL #3   Title  Patient will increase P/ROM of LUE to [facility] in order to increase ability to complete upper body dressing tasks.     Time  6    Period  Weeks    Status  On-going      OT SHORT TERM GOAL #4   Title   Patient will decrease LUE fascial restrictions to moderate amount in order to increase functional mobility needed to complete reaching tasks below shoulder level.     Time  6    Period  Weeks    Status  On-going      OT SHORT TERM GOAL #5   Title  Patient increase LUE A/ROM by 10 degrees in order to increase ability to get shirts on and off with less difficulty.     Time  6    Period  Weeks    Status  On-going               Plan - 08/29/18 1138    Clinical Impression Statement  A: Pt reporting increased pain at night, educated on use of tennis ball for self-massage to medial shoulder blade region. Continued with A/ROM in supine, attempted in sitting however pt unable to complete with exception of er/IR, continued with AA/ROM. Added proximal shoulder strengthening on doorway. Verbal cuing for form and technique.     Plan  P: progress note. Continue working towards all A/ROM in sitting; update HEP for A/ROM in supine, sitting if pt performing with good form       Patient will benefit from skilled therapeutic intervention in order to improve the following deficits and impairments:  Decreased activity tolerance, Decreased strength, Impaired flexibility, Decreased range of motion, Pain, Increased fascial restrictions, Impaired UE functional use  Visit Diagnosis: Other symptoms and signs involving the musculoskeletal system  Acute pain of left shoulder  Stiffness of left shoulder, not elsewhere classified    Problem List Patient Active Problem List   Diagnosis Date Noted  . PATELLAR DISLOCATION, LEFT 02/13/2010  . TEAR M C L 01/09/2010  . MEDIAL MENISCUS TEAR, LEFT 01/02/2010   Guadelupe Sabin, OTR/L  [phone] 08/29/2018, 11:55 AM  [place], [place], [address] Phone: [phone]  Fax:  [phone]  Name: Ann Ortega MRN: [id number] Date of Birth: March 03, 1946

## 2018-09-09 ENCOUNTER — Encounter (HOSPITAL_COMMUNITY): Payer: Self-pay | Admitting: Occupational Therapy

## 2018-09-09 ENCOUNTER — Ambulatory Visit (HOSPITAL_COMMUNITY): Payer: Medicare Other | Attending: Orthopedic Surgery | Admitting: Occupational Therapy

## 2018-09-09 DIAGNOSIS — M25512 Pain in left shoulder: Secondary | ICD-10-CM | POA: Diagnosis not present

## 2018-09-09 DIAGNOSIS — M25612 Stiffness of left shoulder, not elsewhere classified: Secondary | ICD-10-CM | POA: Diagnosis not present

## 2018-09-09 DIAGNOSIS — R29898 Other symptoms and signs involving the musculoskeletal system: Secondary | ICD-10-CM

## 2018-09-09 NOTE — Therapy (Signed)
[place], [place], [address] Phone: [phone]   Fax:  [phone]  Occupational Therapy Treatment  Patient Details  Name: Ann Ortega MRN: [id number] Date of Birth: 05/19/1946 Referring Provider (OT): Arther Abbott   Progress Note Reporting Period 08/06/2018 to 09/09/2018  See note below for Objective Data and Assessment of Progress/Goals.       Encounter Date: 09/09/2018  OT End of Session - 09/09/18 1600    Visit Number  9    Number of Visits  12    Date for OT Re-Evaluation  09/18/18    Authorization Type  Medicare Part A & B; [employer]    OT Start Time  1517    OT Stop Time  1559    OT Time Calculation (min)  42 min    Activity Tolerance  Patient tolerated treatment well;Patient limited by pain    Behavior During Therapy  [facility] for tasks assessed/performed       Past Medical History:  Diagnosis Date  . Thyroid disease     Past Surgical History:  Procedure Laterality Date  . BREAST EXCISIONAL BIOPSY Left     There were no vitals filed for this visit.  Subjective Assessment - 09/09/18 1516    Subjective   S: I didn't do my exercises one day and my shoulder was hurting.     Currently in Pain?  Yes    Pain Score  4     Pain Location  Shoulder    Pain Orientation  Left    Pain Descriptors / Indicators  Aching;Sore    Pain Type  Acute pain    Pain Radiating Towards  N/A    Pain Onset  1 to 4 weeks ago    Pain Frequency  Intermittent    Aggravating Factors   movement, use    Pain Relieving Factors  heat, ice, pain medication    Effect of Pain on Daily Activities  mod effect on use of LUE on ADLs    Multiple Pain Sites  No                   OT Treatments/Exercises (OP) - 09/09/18 1523      Exercises   Exercises  Shoulder      Shoulder Exercises: Supine   Protraction  PROM;5 reps;AROM;12 reps    Horizontal ABduction  PROM;5 reps;AROM;12 reps    External Rotation  PROM;5  reps;AROM;12 reps    Internal Rotation  PROM;5 reps;AROM;12 reps    Flexion  PROM;5 reps;AROM;12 reps    ABduction  PROM;5 reps;AROM;12 reps      Shoulder Exercises: Seated   Protraction  AROM;10 reps    Horizontal ABduction  AROM;10 reps   2 rest breaks   External Rotation  AROM;12 reps    Internal Rotation  AROM;12 reps    Flexion  AROM;10 reps    Abduction  AROM;10 reps      Shoulder Exercises: Standing   Extension  Theraband;10 reps    Theraband Level (Shoulder Extension)  Level 2 (Red)    Row  Theraband;10 reps    Theraband Level (Shoulder Row)  Level 2 (Red)    Retraction  Theraband;10 reps    Theraband Level (Shoulder Retraction)  Level 2 (Red)      Shoulder Exercises: ROM/Strengthening   UBE (Upper Arm Bike)  Level 1 2' forward 2' reverse    Proximal Shoulder Strengthening, Supine  12X each no  rest breaks    Other ROM/Strengthening Exercises  proximal shoulder strengthening on door, 1' flexion      Manual Therapy   Manual Therapy  Myofascial release    Manual therapy comments  manual therapy completed prior to exercises.     Myofascial Release  Myofascial release and manual stretching completed to left upper arm, trapezius, and scapularis region to decrease fascial restrictions and increase joint mobility in a pain zone.              OT Education - 09/09/18 1600    Education Details  A/ROM in supine and sitting for LUE    Person(s) Educated  Patient    Methods  Explanation;Demonstration;Handout    Comprehension  Verbalized understanding;Returned demonstration       OT Short Term Goals - 08/08/18 1518      OT SHORT TERM GOAL #1   Title  Pt will be provided with and educated on HEP to improve ability to use LUE during ADLs with increased comfort    Time  6    Period  Weeks    Status  On-going      OT SHORT TERM GOAL #2   Title  Patient will report a decrease in LUE pain of approximately 4/10 while completing daily tasks.     Time  6    Period  Weeks     Status  On-going      OT SHORT TERM GOAL #3   Title  Patient will increase P/ROM of LUE to [facility] in order to increase ability to complete upper body dressing tasks.     Time  6    Period  Weeks    Status  On-going      OT SHORT TERM GOAL #4   Title  Patient will decrease LUE fascial restrictions to moderate amount in order to increase functional mobility needed to complete reaching tasks below shoulder level.     Time  6    Period  Weeks    Status  On-going      OT SHORT TERM GOAL #5   Title  Patient increase LUE A/ROM by 10 degrees in order to increase ability to get shirts on and off with less difficulty.     Time  6    Period  Weeks    Status  On-going               Plan - 09/09/18 1600    Clinical Impression Statement  A: Pt reports missing one day of exercise increased her stiffness and pain over the weekend. Pt reports improved ability to reach overhead for objects and objects out to the side. Continue with A/ROM, pt able to complete all A/ROM in sitting with occasional rest breaks. ROM is WNL for all planes today, continues to have decreased strength and activity tolerance deficits limiting use during ADLs.  Continued with scapular theraband and proximal shoulder strengthening, added UBE this date. Verbal cuing for form and technique.     Plan  P: Follow up on HEP, attempt seated proximal shoulder strengthening and complete functional reaching taks       Patient will benefit from skilled therapeutic intervention in order to improve the following deficits and impairments:  Decreased activity tolerance, Decreased strength, Impaired flexibility, Decreased range of motion, Pain, Increased fascial restrictions, Impaired UE functional use  Visit Diagnosis: Other symptoms and signs involving the musculoskeletal system  Acute pain of left shoulder  Stiffness of left shoulder, not  elsewhere classified    Problem List Patient Active Problem List   Diagnosis Date Noted   . PATELLAR DISLOCATION, LEFT 02/13/2010  . TEAR M C L 01/09/2010  . MEDIAL MENISCUS TEAR, LEFT 01/02/2010   Guadelupe Sabin, OTR/L  [phone] 09/09/2018, 4:03 PM  [facility] [address] [place], [place], [address] Phone: [phone]   Fax:  [phone]  Name: Ann Ortega MRN: [id number] Date of Birth: [birth year]

## 2018-09-09 NOTE — Patient Instructions (Signed)

## 2018-09-10 ENCOUNTER — Encounter (HOSPITAL_COMMUNITY): Payer: Self-pay | Admitting: Occupational Therapy

## 2018-09-10 ENCOUNTER — Ambulatory Visit (HOSPITAL_COMMUNITY): Payer: Medicare Other | Admitting: Occupational Therapy

## 2018-09-10 DIAGNOSIS — R29898 Other symptoms and signs involving the musculoskeletal system: Secondary | ICD-10-CM | POA: Diagnosis not present

## 2018-09-10 DIAGNOSIS — M25612 Stiffness of left shoulder, not elsewhere classified: Secondary | ICD-10-CM | POA: Diagnosis not present

## 2018-09-10 DIAGNOSIS — M25512 Pain in left shoulder: Secondary | ICD-10-CM | POA: Diagnosis not present

## 2018-09-10 NOTE — Therapy (Signed)
[place], [place], [address] Phone: [phone]   Fax:  [phone]  Occupational Therapy Treatment  Patient Details  Name: Ann Ortega MRN: [id number] Date of Birth: [birth year] Referring Provider (OT): Arther Abbott   Encounter Date: 09/10/2018  OT End of Session - 09/10/18 1142    Visit Number  10    Number of Visits  12    Date for OT Re-Evaluation  09/18/18    Authorization Type  Medicare Part A & B; [employer]    OT Start Time  1117    OT Stop Time  1159    OT Time Calculation (min)  42 min    Activity Tolerance  Patient tolerated treatment well;Patient limited by pain    Behavior During Therapy  [facility] for tasks assessed/performed       Past Medical History:  Diagnosis Date  . Thyroid disease     Past Surgical History:  Procedure Laterality Date  . BREAST EXCISIONAL BIOPSY Left     There were no vitals filed for this visit.  Subjective Assessment - 09/10/18 1117    Subjective   S: I try to get my husband to stretch my arm but he doesn't do it right.     Currently in Pain?  Yes    Pain Score  4     Pain Location  Shoulder    Pain Orientation  Left    Pain Descriptors / Indicators  Sore    Pain Type  Acute pain    Pain Radiating Towards  N/A    Pain Onset  1 to 4 weeks ago    Pain Frequency  Intermittent    Aggravating Factors   movement, use    Pain Relieving Factors  ice, pain medication    Effect of Pain on Daily Activities  mod effect on use of LUE during ADLs    Multiple Pain Sites  No         [facility] OT Assessment - 09/10/18 1117      Assessment   Medical Diagnosis  left Shoulder pain      Precautions   Precautions  None               OT Treatments/Exercises (OP) - 09/10/18 1117      Exercises   Exercises  Shoulder      Shoulder Exercises: Supine   Protraction  PROM;5 reps;AROM;12 reps    Horizontal ABduction  PROM;5 reps;AROM;12 reps    External Rotation  PROM;5  reps;AROM;12 reps    Internal Rotation  PROM;5 reps;AROM;12 reps    Flexion  PROM;5 reps;AROM;12 reps    ABduction  PROM;5 reps;AROM;12 reps      Shoulder Exercises: Seated   Protraction  AROM;10 reps    Horizontal ABduction  AROM;10 reps   2 rest breaks   External Rotation  AROM;12 reps    Internal Rotation  AROM;12 reps    Flexion  AROM;10 reps    Abduction  AROM;10 reps      Shoulder Exercises: Standing   Extension  Theraband;10 reps    Theraband Level (Shoulder Extension)  Level 2 (Red)    Row  Theraband;10 reps    Theraband Level (Shoulder Row)  Level 2 (Red)    Retraction  Theraband;10 reps    Theraband Level (Shoulder Retraction)  Level 2 (Red)      Shoulder Exercises: ROM/Strengthening   Over Head Lace  1' seated at bodycraft  pulley    Proximal Shoulder Strengthening, Supine  12X each no rest breaks    Proximal Shoulder Strengthening, Seated  10X each, 1 rest break      Manual Therapy   Manual Therapy  Myofascial release    Manual therapy comments  manual therapy completed prior to exercises.     Myofascial Release  Myofascial release and manual stretching completed to left upper arm, trapezius, and scapularis region to decrease fascial restrictions and increase joint mobility in a pain zone.              OT Education - 09/10/18 1147    Education Details  scapular theraband strengthening-red    Person(s) Educated  Patient    Methods  Explanation;Demonstration;Handout    Comprehension  Verbalized understanding;Returned demonstration       OT Short Term Goals - 08/08/18 1518      OT SHORT TERM GOAL #1   Title  Pt will be provided with and educated on HEP to improve ability to use LUE during ADLs with increased comfort    Time  6    Period  Weeks    Status  On-going      OT SHORT TERM GOAL #2   Title  Patient will report a decrease in LUE pain of approximately 4/10 while completing daily tasks.     Time  6    Period  Weeks    Status  On-going      OT  SHORT TERM GOAL #3   Title  Patient will increase P/ROM of LUE to [facility] in order to increase ability to complete upper body dressing tasks.     Time  6    Period  Weeks    Status  On-going      OT SHORT TERM GOAL #4   Title  Patient will decrease LUE fascial restrictions to moderate amount in order to increase functional mobility needed to complete reaching tasks below shoulder level.     Time  6    Period  Weeks    Status  On-going      OT SHORT TERM GOAL #5   Title  Patient increase LUE A/ROM by 10 degrees in order to increase ability to get shirts on and off with less difficulty.     Time  6    Period  Weeks    Status  On-going               Plan - 09/10/18 1140    Clinical Impression Statement  A: Pt reports no increased pain after therapy session yesterday and HEP is going well, continued with manual therapy today to address fascial restrictions in trapezius and along medial border of scapula. Continued with A/ROM, improvement in activity tolerance noted this date especially with seated A/ROM. Added overhead lacing, pt with max difficulty with required for task completion.Verbal cuing for form and technique.     Plan  P: Attempt A/ROM in sidelying, add x to v arms       Patient will benefit from skilled therapeutic intervention in order to improve the following deficits and impairments:  Decreased activity tolerance, Decreased strength, Impaired flexibility, Decreased range of motion, Pain, Increased fascial restrictions, Impaired UE functional use  Visit Diagnosis: Other symptoms and signs involving the musculoskeletal system  Acute pain of left shoulder  Stiffness of left shoulder, not elsewhere classified    Problem List Patient Active Problem List   Diagnosis Date Noted  . PATELLAR DISLOCATION,  LEFT 02/13/2010  . TEAR M C L 01/09/2010  . MEDIAL MENISCUS TEAR, LEFT 01/02/2010   Guadelupe Sabin, OTR/L  [phone] 09/10/2018, 12:02 PM  [facility] [facility] [address] [place], [place], [address] Phone: [phone]   Fax:  [phone]  Name: Ann Ortega MRN: [id number] Date of Birth: 1946/01/28

## 2018-09-10 NOTE — Patient Instructions (Signed)

## 2018-09-16 ENCOUNTER — Encounter (HOSPITAL_COMMUNITY): Payer: Self-pay | Admitting: Occupational Therapy

## 2018-09-16 ENCOUNTER — Ambulatory Visit (HOSPITAL_COMMUNITY): Payer: Medicare Other | Admitting: Occupational Therapy

## 2018-09-16 DIAGNOSIS — M25612 Stiffness of left shoulder, not elsewhere classified: Secondary | ICD-10-CM | POA: Diagnosis not present

## 2018-09-16 DIAGNOSIS — R29898 Other symptoms and signs involving the musculoskeletal system: Secondary | ICD-10-CM | POA: Diagnosis not present

## 2018-09-16 DIAGNOSIS — M25512 Pain in left shoulder: Secondary | ICD-10-CM

## 2018-09-16 NOTE — Therapy (Signed)
[place], [place], [address] Phone: [phone]   Fax:  [phone]  Occupational Therapy Reassessment, Treatment, and Discharge  Patient Details  Name: Ann Ortega MRN: [id number] Date of Birth: 1945/08/28 Referring Provider (OT): Arther Abbott   Encounter Date: 09/16/2018  OT End of Session - 09/16/18 1552    Visit Number  11    Number of Visits  12    Date for OT Re-Evaluation  09/18/18    Authorization Type  Medicare Part A & B; [employer]    OT Start Time  1510    OT Stop Time  1551    OT Time Calculation (min)  41 min    Activity Tolerance  Patient tolerated treatment well;Patient limited by pain    Behavior During Therapy  [facility] for tasks assessed/performed       Past Medical History:  Diagnosis Date  . Thyroid disease     Past Surgical History:  Procedure Laterality Date  . BREAST EXCISIONAL BIOPSY Left     There were no vitals filed for this visit.  Subjective Assessment - 09/16/18 1509    Subjective   S: I got this roll on stuff that has been helping me.     Currently in Pain?  Yes    Pain Score  2     Pain Location  Shoulder    Pain Orientation  Left    Pain Descriptors / Indicators  Sore    Pain Type  Acute pain    Pain Radiating Towards  N/A    Pain Onset  More than a month ago    Pain Frequency  Intermittent    Aggravating Factors   movement, use    Pain Relieving Factors  ice, pain medication    Effect of Pain on Daily Activities  mod effect on use of LUE during ADLs    Multiple Pain Sites  No         [facility] OT Assessment - 09/16/18 1508      Assessment   Medical Diagnosis  left Shoulder pain      Precautions   Precautions  None      Palpation   Palpation comment  Min fascial restrictions in the left upper arm, trapezius. and scapularis region.       AROM   Overall AROM Comments  Assessed seated, er/IR adducted    AROM Assessment Site  Shoulder    Right/Left Shoulder   Left    Left Shoulder Flexion  150 Degrees   95 previous   Left Shoulder ABduction  180 Degrees   84 previous   Left Shoulder Internal Rotation  90 Degrees   same as previous   Left Shoulder External Rotation  80 Degrees   39 previous     PROM   Overall PROM Comments  Assessed supine, er/IR adducted    PROM Assessment Site  Shoulder    Right/Left Shoulder  Left    Left Shoulder Flexion  156 Degrees   105 previous   Left Shoulder ABduction  180 Degrees   87 previous   Left Shoulder Internal Rotation  90 Degrees   same as previous   Left Shoulder External Rotation  90 Degrees   53 previous     Strength   Overall Strength Comments  Assessed seated, er/IR adducted    Strength Assessment Site  Shoulder    Right/Left Shoulder  Left    Left Shoulder Flexion  4-/5   3-/5 previous   Left Shoulder ABduction  3+/5   3-/5 previous   Left Shoulder Internal Rotation  4/5   3/5 previous   Left Shoulder External Rotation  3/5   3-/5 previous              OT Treatments/Exercises (OP) - 09/16/18 1509      Exercises   Exercises  Shoulder      Shoulder Exercises: Supine   Protraction  PROM;5 reps;AROM;12 reps    Horizontal ABduction  PROM;5 reps;AROM;12 reps    External Rotation  PROM;5 reps;AROM;12 reps    Internal Rotation  PROM;5 reps;AROM;12 reps    Flexion  PROM;5 reps;AROM;12 reps    ABduction  PROM;5 reps;AROM;12 reps      Shoulder Exercises: Standing   Protraction  Theraband;10 reps    Theraband Level (Shoulder Protraction)  Level 2 (Red)    Horizontal ABduction  Theraband;10 reps    Theraband Level (Shoulder Horizontal ABduction)  Level 2 (Red)    External Rotation  Theraband;10 reps    Theraband Level (Shoulder External Rotation)  Level 2 (Red)    Internal Rotation  Theraband;10 reps    Theraband Level (Shoulder Internal Rotation)  Level 2 (Red)      Manual Therapy   Manual Therapy  Myofascial release    Manual therapy comments  manual therapy completed  prior to exercises.     Myofascial Release  Myofascial release and manual stretching completed to left upper arm, trapezius, and scapularis region to decrease fascial restrictions and increase joint mobility in a pain zone.              OT Education - 09/16/18 1538    Education Details  LUE strengthening with red theraband; instructed to continue scapular theraband and LUE A/ROM    Person(s) Educated  Patient    Methods  Explanation;Demonstration;Handout    Comprehension  Verbalized understanding;Returned demonstration       OT Short Term Goals - 09/16/18 1552      OT SHORT TERM GOAL #1   Title  Pt will be provided with and educated on HEP to improve ability to use LUE during ADLs with increased comfort    Time  6    Period  Weeks    Status  Achieved      OT SHORT TERM GOAL #2   Title  Patient will report a decrease in LUE pain of approximately 4/10 while completing daily tasks.     Time  6    Period  Weeks    Status  Achieved      OT SHORT TERM GOAL #3   Title  Patient will increase P/ROM of LUE to [facility] in order to increase ability to complete upper body dressing tasks.     Time  6    Period  Weeks    Status  Achieved      OT SHORT TERM GOAL #4   Title  Patient will decrease LUE fascial restrictions to moderate amount in order to increase functional mobility needed to complete reaching tasks below shoulder level.     Time  6    Period  Weeks    Status  Achieved      OT SHORT TERM GOAL #5   Title  Patient increase LUE A/ROM by 10 degrees in order to increase ability to get shirts on and off with less difficulty.     Time  6    Period  Weeks    Status  Achieved               Plan - 09/16/18 1553    Clinical Impression Statement  A: Reassessment completed this session, pt has met all goals and has made excellent progress with her ROM, strength, and pain level. Pt is able to complete all ADLs independently with exception of lifting tasks. She uses two hands  to lift plates into the microwave and does not attempt to lift heavy items due to weakness from left RC tear. Educated pt on progress, pt is agreeable to discharge with HEP to complete until she has sx to repair the RC tear. Pt performing exercises with verbal cuing today.     Plan  P: Discharge pt       Patient will benefit from skilled therapeutic intervention in order to improve the following deficits and impairments:  Decreased activity tolerance, Decreased strength, Impaired flexibility, Decreased range of motion, Pain, Increased fascial restrictions, Impaired UE functional use  Visit Diagnosis: Other symptoms and signs involving the musculoskeletal system  Acute pain of left shoulder  Stiffness of left shoulder, not elsewhere classified    Problem List Patient Active Problem List   Diagnosis Date Noted  . PATELLAR DISLOCATION, LEFT 02/13/2010  . TEAR M C L 01/09/2010  . MEDIAL MENISCUS TEAR, LEFT 01/02/2010   Guadelupe Sabin, OTR/L  [phone] 09/16/2018, 3:56 PM  [place], [place], [address] Phone: [phone]   Fax:  [phone]  Name: Ann Ortega MRN: [id number] Date of Birth: May 05, 1946   OCCUPATIONAL THERAPY DISCHARGE SUMMARY  Visits from Start of Care: 11  Current functional level related to goals / functional outcomes: See above. Pt has met all goals and is completing ADLs independently.    Remaining deficits: Continued strength and functional use limitations due to left RC tear   Education / Equipment: HEP for ROM and strengthening until pt has sx Plan: Patient agrees to discharge.  Patient goals were met. Patient is being discharged due to meeting the stated rehab goals.  ?????

## 2018-09-16 NOTE — Patient Instructions (Addendum)
Theraband strengthening: Complete 10-15X, 1-2X/day  1) Shoulder protraction  Anchor band in doorway, stand with back to door. Push your hand forward as much as you can to bringing your shoulder blades forward on your rib cage.      2) Shoulder horizontal abduction  Standing with a theraband anchored at chest height, begin with arm straight and some tension in the band. Move your arm out to your side (keeping straight the whole time). Bring the affected arm back to midline.     3) Shoulder Internal Rotation  While holding an elastic band at your side with your elbow bent, start with your hand away from your stomach, then pull the band towards your stomach. Keep your elbow near your side the entire time.     4) Shoulder External Rotation  While holding an elastic band at your side with your elbow bent, start with your hand near your stomach and then pull the band away. Keep your elbow at your side the entire time.       

## 2018-09-17 ENCOUNTER — Ambulatory Visit (INDEPENDENT_AMBULATORY_CARE_PROVIDER_SITE_OTHER): Payer: Medicare Other | Admitting: Orthopedic Surgery

## 2018-09-17 ENCOUNTER — Encounter: Payer: Self-pay | Admitting: Orthopedic Surgery

## 2018-09-17 DIAGNOSIS — M19212 Secondary osteoarthritis, left shoulder: Secondary | ICD-10-CM | POA: Diagnosis not present

## 2018-09-17 DIAGNOSIS — S46012D Strain of muscle(s) and tendon(s) of the rotator cuff of left shoulder, subsequent encounter: Secondary | ICD-10-CM | POA: Diagnosis not present

## 2018-09-17 NOTE — Patient Instructions (Signed)
Reverse Total Shoulder Replacement, Care After This sheet gives you information about how to care for yourself after your procedure. Your health care provider may also give you more specific instructions. If you have problems or questions, contact your health care provider. What can I expect after the procedure? After the procedure, it is common to have:  Pain.  Stiffness. Follow these instructions at home: If you have a sling:  Wear the sling as told by your health care provider. Remove it only as told by your health care provider.  Loosen the sling if your fingers tingle, become numb, or turn cold and blue.  Keep the sling clean.  If the sling is not waterproof, do not let it get wet. Bathing  Do not take baths, swim, or use a hot tub until your health care provider approves. Ask your health care provider if you may take showers. You may only be allowed to take sponge baths for bathing.  If your sling is not waterproof, cover it with a watertight covering when you take a bath or shower.  Keep your bandage (dressing) dry until your health care provider says it can be removed. Incision care   Follow instructions from your health care provider about how to take care of your incision. Make sure you: ? Wash your hands with soap and water before you change your bandage (dressing). If soap and water are not available, use hand sanitizer. ? Change your dressing as told by your health care provider. ? Leave stitches (sutures), skin glue, or adhesive strips in place. These skin closures may need to stay in place for 2 weeks or longer. If adhesive strip edges start to loosen and curl up, you may trim the loose edges. Do not remove adhesive strips completely unless your health care provider tells you to do that.  Check your incision every day for signs of infection. Check for: ? More redness, swelling, or pain. ? More fluid or blood. ? Warmth. ? Pus or a bad smell. Driving  Ask your  health care provider when it is safe for you to drive.  Do not drive or use heavy machinery while taking prescription pain medicine.  Do not drive for 24 hours if you were given a medicine to help you relax (sedative). Activity  Return to your normal activities as told by your health care provider. Ask your health care provider what activities are safe for you.  Do shoulder exercises as told by your health care provider.  Do not lift your arm above shoulder level until your health care provider approves.  Do not make large arm movements.  Do not push or pull things until your health care provider approves.  Do not lift anything that is heavier than 5 lbs (2.3 kg) until your health care provider approves. Managing pain, stiffness, and swelling   If directed, put ice on your shoulder. ? Put ice in a plastic bag. ? Place a towel between your skin and the bag. ? Leave the ice on for 20 minutes, 2-3 times a day.  Move your fingers and hand often to avoid stiffness and to lessen swelling. General instructions  Do not use any products that contain nicotine or tobacco, such as cigarettes and e-cigarettes. These can delay bone healing. If you need help quitting, ask your health care provider.  To prevent or treat constipation while you are taking prescription pain medicine, your health care provider may recommend that you: ? Drink enough fluid to keep your  urine clear or pale yellow. ? Take over-the-counter or prescription medicines. ? Eat foods that are high in fiber, such as fresh fruits and vegetables, whole grains, and beans. ? Limit foods that are high in fat and processed sugars, such as fried and sweet foods.  Take over-the-counter and prescription medicines only as told by your health care provider.  Keep all follow-up visits as told by your health care provider. This is important. Contact a health care provider if:  You feel nauseous or you vomit.  You are constipated.  Constipation is when you have: ? Fewer bowel movements in a week than normal. ? Difficulty having a bowel movement. ? Stools that are dry, hard, or larger than normal.  Your arm tingles or feels numb.  Your pain gets worse, even after taking pain medicine.  You have more redness, swelling, or pain around your incision.  You have more fluid or blood coming from your incision.  Your incision feels warm to the touch.  You have pus or a bad smell coming from your incision.  You have a fever. Get help right away if:  Your shoulder joint moves out of place.  Your incision comes apart. This information is not intended to replace advice given to you by your health care provider. Make sure you discuss any questions you have with your health care provider. Document Released: 01/17/2016 Document Revised: 02/03/2016 Document Reviewed: 01/17/2016 Elsevier Interactive Patient Education  2019 [place].  Reverse Total Shoulder Replacement, Care After This sheet gives you information about how to care for yourself after your procedure. Your health care provider may also give you more specific instructions. If you have problems or questions, contact your health care provider. What can I expect after the procedure? After the procedure, it is common to have:  Pain.  Stiffness. Follow these instructions at home: If you have a sling:  Wear the sling as told by your health care provider. Remove it only as told by your health care provider.  Loosen the sling if your fingers tingle, become numb, or turn cold and blue.  Keep the sling clean.  If the sling is not waterproof, do not let it get wet. Bathing  Do not take baths, swim, or use a hot tub until your health care provider approves. Ask your health care provider if you may take showers. You may only be allowed to take sponge baths for bathing.  If your sling is not waterproof, cover it with a watertight covering when you take a bath  or shower.  Keep your bandage (dressing) dry until your health care provider says it can be removed. Incision care   Follow instructions from your health care provider about how to take care of your incision. Make sure you: ? Wash your hands with soap and water before you change your bandage (dressing). If soap and water are not available, use hand sanitizer. ? Change your dressing as told by your health care provider. ? Leave stitches (sutures), skin glue, or adhesive strips in place. These skin closures may need to stay in place for 2 weeks or longer. If adhesive strip edges start to loosen and curl up, you may trim the loose edges. Do not remove adhesive strips completely unless your health care provider tells you to do that.  Check your incision every day for signs of infection. Check for: ? More redness, swelling, or pain. ? More fluid or blood. ? Warmth. ? Pus or a bad smell. Driving  Ask your health care provider when it is safe for you to drive.  Do not drive or use heavy machinery while taking prescription pain medicine.  Do not drive for 24 hours if you were given a medicine to help you relax (sedative). Activity  Return to your normal activities as told by your health care provider. Ask your health care provider what activities are safe for you.  Do shoulder exercises as told by your health care provider.  Do not lift your arm above shoulder level until your health care provider approves.  Do not make large arm movements.  Do not push or pull things until your health care provider approves.  Do not lift anything that is heavier than 5 lbs (2.3 kg) until your health care provider approves. Managing pain, stiffness, and swelling   If directed, put ice on your shoulder. ? Put ice in a plastic bag. ? Place a towel between your skin and the bag. ? Leave the ice on for 20 minutes, 2-3 times a day.  Move your fingers and hand often to avoid stiffness and to lessen  swelling. General instructions  Do not use any products that contain nicotine or tobacco, such as cigarettes and e-cigarettes. These can delay bone healing. If you need help quitting, ask your health care provider.  To prevent or treat constipation while you are taking prescription pain medicine, your health care provider may recommend that you: ? Drink enough fluid to keep your urine clear or pale yellow. ? Take over-the-counter or prescription medicines. ? Eat foods that are high in fiber, such as fresh fruits and vegetables, whole grains, and beans. ? Limit foods that are high in fat and processed sugars, such as fried and sweet foods.  Take over-the-counter and prescription medicines only as told by your health care provider.  Keep all follow-up visits as told by your health care provider. This is important. Contact a health care provider if:  You feel nauseous or you vomit.  You are constipated. Constipation is when you have: ? Fewer bowel movements in a week than normal. ? Difficulty having a bowel movement. ? Stools that are dry, hard, or larger than normal.  Your arm tingles or feels numb.  Your pain gets worse, even after taking pain medicine.  You have more redness, swelling, or pain around your incision.  You have more fluid or blood coming from your incision.  Your incision feels warm to the touch.  You have pus or a bad smell coming from your incision.  You have a fever. Get help right away if:  Your shoulder joint moves out of place.  Your incision comes apart. This information is not intended to replace advice given to you by your health care provider. Make sure you discuss any questions you have with your health care provider. Document Released: 01/17/2016 Document Revised: 02/03/2016 Document Reviewed: 01/17/2016 Elsevier Interactive Patient Education  2019 [employer].

## 2018-09-17 NOTE — Addendum Note (Signed)
Addended byCandice Camp on: 09/17/2018 02:33 PM   Modules accepted: Orders

## 2018-09-17 NOTE — Progress Notes (Signed)
  Chief complaint pain left shoulder  This is a 73 year old female who had a traumatic tear on top of a chronic tear of the left shoulder she had an MRI that showed complete rotator cuff tear with severe retraction back to the glenoid.  She had a partial subscapularis tear as well.  After injection and physical therapy she is regained 150 degrees of forward elevation but still has pain and weakness in the left shoulder unrelieved by ibuprofen and tramadol  She is here today to discuss possible surgery on the left shoulder after her physical therapy visit  Her physical therapy notes indicate significant improvements in range of motion mild improvements in strength  She exhibits active forward elevation of 150 degrees   CLINICAL DATA:  Left shoulder pain for 3 months since a fall. Initial encounter.   EXAM: MRI OF THE LEFT SHOULDER WITHOUT CONTRAST   TECHNIQUE: Multiplanar, multisequence MR imaging of the shoulder was performed. No intravenous contrast was administered.   COMPARISON:  Plain films left shoulder 07/07/2018.   FINDINGS: Rotator cuff: The patient has a complete supraspinatus tendon tear extending into approximately the anterior 50% of the infraspinatus. Retraction is just medial to the top of the humeral head, 3.5-4.5 cm. The subscapularis is almost completely torn. A small band of the inferior most fibers of the tendon are intact but markedly attenuated and tendinopathic.   Muscles:  No focal atrophy or lesion.   Biceps long head:  Completely torn from the superior labrum.   Acromioclavicular Joint: Moderate osteoarthritis is seen. Type 2 acromion. There is extensive subacromial spurring. Fluid is present in the subacromial/subdeltoid bursa.   Glenohumeral Joint: Moderately severe degenerative change is present with cartilage loss, joint space narrowing and an osteophyte off the humeral head. There is a small joint effusion containing debris, likely tiny  cartilage fragments.   Labrum:  The superior labrum is severely degenerated.   Bones: No fracture or worrisome lesion. Edema in the lesser tuberosity is consistent with reactive change from rotator cuff disease.   Other: None.   IMPRESSION: Complete supraspinatus tendon tear extends into a large segment of the anterior infraspinatus with 3.5-4.5 cm of retraction. The subscapularis is almost completely torn with severe tendinopathy of intact fibers inferiorly. Retraction is mild at 1-2 cm. No atrophy of the rotator cuff.   Complete tear of the long head of biceps from the superior labrum.   Moderately severe glenohumeral osteoarthritis.   Moderate acromioclavicular osteoarthritis. Type 2 acromion with extensive subacromial spurring noted.   Subacromial/subdeltoid fluid consistent with bursitis.     Electronically Signed   By: Inge Rise M.D.   On: 07/17/2018 11:02   We discussed this further with her husband present for about 15 minutes and we decided that she will see Dr. Marlou Sa for possible reverse shoulder replacement if the cuff cannot be repaired.  Of course other options include superior capsular reconstruction.  She does have a significant amount of glenohumeral arthritis.  She will discussed this with him and if necessary go ahead and have surgery after her trip

## 2018-09-18 ENCOUNTER — Encounter (HOSPITAL_COMMUNITY): Payer: Self-pay | Admitting: Occupational Therapy

## 2018-09-19 ENCOUNTER — Ambulatory Visit (HOSPITAL_COMMUNITY): Payer: Medicare Other | Admitting: Occupational Therapy

## 2018-09-22 ENCOUNTER — Ambulatory Visit: Payer: Medicare Other | Admitting: Orthopedic Surgery

## 2018-09-24 ENCOUNTER — Encounter (INDEPENDENT_AMBULATORY_CARE_PROVIDER_SITE_OTHER): Payer: Self-pay | Admitting: Orthopedic Surgery

## 2018-09-24 ENCOUNTER — Ambulatory Visit (INDEPENDENT_AMBULATORY_CARE_PROVIDER_SITE_OTHER): Payer: Medicare Other | Admitting: Orthopedic Surgery

## 2018-09-24 DIAGNOSIS — M12812 Other specific arthropathies, not elsewhere classified, left shoulder: Secondary | ICD-10-CM | POA: Diagnosis not present

## 2018-09-24 NOTE — Progress Notes (Signed)
Office Visit Note   Patient: Ann Ortega           Date of Birth: Jun 03, 1946           MRN: [id number] Visit Date: 09/24/2018 Requested by: Carole Civil, MD [address] [place], [place] [address] PCP: Redmond School, MD  Subjective: Chief Complaint  Patient presents with  . Left Shoulder - Pain    HPI: Ann Ortega is a 73 year old patient with left shoulder pain.  She is had pain for about 3 months since a fall in December.  History of prior rotator cuff pathology in that shoulder which is chronic.  She is right-hand dominant.  She likes to workout in the garden during the summer.  She states it is hard for her to pick up heavy things.  She states she had an injection which helped her shoulder and therapy is also helping her shoulder.              ROS: All systems reviewed are negative as they relate to the chief complaint within the history of present illness.  Patient denies  fevers or chills.   Assessment & Plan: Visit Diagnoses:  1. Rotator cuff arthropathy of left shoulder     Plan: Impression is left shoulder rotator cuff arthropathy which is on a trajectory of improvement at this time.  Currently she has about 170 forward flexion on the left and 90 of abduction.  She does have some weakness to subscap and supraspinatus and infraspinatus testing but it is not really too profound.  I would favor observation for the next 4 months to see where she lands in terms of how much more she improves.  We will see her back for reassessment in 4 months.  No surgery for now.  Follow-Up Instructions: Return in about 4 months (around 01/23/2019).   Orders:  No orders of the defined types were placed in this encounter.  No orders of the defined types were placed in this encounter.     Procedures: No procedures performed   Clinical Data: No additional findings.  Objective: Vital Signs: There were no vitals taken for this visit.  Physical Exam:   Constitutional: Patient  appears well-developed HEENT:  Head: Normocephalic Eyes:EOM are normal Neck: Normal range of motion Cardiovascular: Normal rate Pulmonary/chest: Effort normal Neurologic: Patient is alert Skin: Skin is warm Psychiatric: Patient has normal mood and affect    Ortho Exam: Ortho exam demonstrates good cervical spine range of motion.  Patient has functional deltoid on the left.  She has 4 out of 5 subscap strength and 4 out of 5 infraspinatus strength on the left compared to right.  She has 170 of forward flexion and 90 degrees of abduction on the left.  No other masses lymphadenopathy or skin changes noted in that shoulder girdle region.  She does have little bit of crepitus with passive range of motion of that left arm.  Specialty Comments:  No specialty comments available.  Imaging: No results found.   PMFS History: Patient Active Problem List   Diagnosis Date Noted  . PATELLAR DISLOCATION, LEFT 02/13/2010  . TEAR M C L 01/09/2010  . MEDIAL MENISCUS TEAR, LEFT 01/02/2010   Past Medical History:  Diagnosis Date  . Thyroid disease     History reviewed. No pertinent family history.  Past Surgical History:  Procedure Laterality Date  . BREAST EXCISIONAL BIOPSY Left    Social History   Occupational History  . Not on file  Tobacco Use  . Smoking status: Never Smoker  . Smokeless tobacco: Never Used  Substance and Sexual Activity  . Alcohol use: Yes    Comment: occ  . Drug use: No  . Sexual activity: Not on file

## 2019-02-26 ENCOUNTER — Other Ambulatory Visit: Payer: Self-pay

## 2019-03-23 DIAGNOSIS — Z23 Encounter for immunization: Secondary | ICD-10-CM | POA: Diagnosis not present

## 2019-04-29 DIAGNOSIS — Z0000 Encounter for general adult medical examination without abnormal findings: Secondary | ICD-10-CM | POA: Diagnosis not present

## 2019-04-29 DIAGNOSIS — Z0001 Encounter for general adult medical examination with abnormal findings: Secondary | ICD-10-CM | POA: Diagnosis not present

## 2019-04-29 DIAGNOSIS — E039 Hypothyroidism, unspecified: Secondary | ICD-10-CM | POA: Diagnosis not present

## 2019-04-29 DIAGNOSIS — Z1389 Encounter for screening for other disorder: Secondary | ICD-10-CM | POA: Diagnosis not present

## 2019-04-29 DIAGNOSIS — Z6826 Body mass index (BMI) 26.0-26.9, adult: Secondary | ICD-10-CM | POA: Diagnosis not present

## 2019-04-29 DIAGNOSIS — E663 Overweight: Secondary | ICD-10-CM | POA: Diagnosis not present

## 2019-04-29 DIAGNOSIS — Z23 Encounter for immunization: Secondary | ICD-10-CM | POA: Diagnosis not present

## 2019-04-29 DIAGNOSIS — E559 Vitamin D deficiency, unspecified: Secondary | ICD-10-CM | POA: Diagnosis not present

## 2019-05-04 ENCOUNTER — Other Ambulatory Visit: Payer: Self-pay | Admitting: Physician Assistant

## 2019-05-04 DIAGNOSIS — Z1231 Encounter for screening mammogram for malignant neoplasm of breast: Secondary | ICD-10-CM

## 2019-06-08 DIAGNOSIS — K219 Gastro-esophageal reflux disease without esophagitis: Secondary | ICD-10-CM | POA: Diagnosis not present

## 2019-06-08 DIAGNOSIS — E039 Hypothyroidism, unspecified: Secondary | ICD-10-CM | POA: Diagnosis not present

## 2019-06-08 DIAGNOSIS — Z6826 Body mass index (BMI) 26.0-26.9, adult: Secondary | ICD-10-CM | POA: Diagnosis not present

## 2019-06-18 ENCOUNTER — Ambulatory Visit
Admission: RE | Admit: 2019-06-18 | Discharge: 2019-06-18 | Disposition: A | Discharge status: Home / Self Care | Payer: Medicare Other | Source: Ambulatory Visit | Attending: Physician Assistant | Admitting: Physician Assistant

## 2019-06-18 ENCOUNTER — Other Ambulatory Visit: Payer: Self-pay

## 2019-06-18 DIAGNOSIS — Z1231 Encounter for screening mammogram for malignant neoplasm of breast: Secondary | ICD-10-CM | POA: Diagnosis not present

## 2019-07-09 DIAGNOSIS — K5900 Constipation, unspecified: Secondary | ICD-10-CM | POA: Diagnosis not present

## 2019-07-09 DIAGNOSIS — K219 Gastro-esophageal reflux disease without esophagitis: Secondary | ICD-10-CM | POA: Diagnosis not present

## 2019-07-09 DIAGNOSIS — Z23 Encounter for immunization: Secondary | ICD-10-CM | POA: Diagnosis not present

## 2019-08-03 DIAGNOSIS — Z1159 Encounter for screening for other viral diseases: Secondary | ICD-10-CM | POA: Diagnosis not present

## 2019-08-22 ENCOUNTER — Ambulatory Visit: Payer: Medicare Other | Attending: Internal Medicine

## 2019-08-22 DIAGNOSIS — Z23 Encounter for immunization: Secondary | ICD-10-CM

## 2019-08-22 NOTE — Progress Notes (Signed)
   Covid-19 Vaccination Clinic  Name:  Ann Ortega    MRN: [id number] DOB: Nov 21, 1945  08/22/2019  [name] was observed post Covid-19 immunization for 15 minutes without incidence. She was provided with Vaccine Information Sheet and instruction to access the V-Safe system.   [name] was instructed to call 911 with any severe reactions post vaccine: Marland Kitchen Difficulty breathing  . Swelling of your face and throat  . A fast heartbeat  . A bad rash all over your body  . Dizziness and weakness    Immunizations Administered    Name Date Dose VIS Date Route   Pfizer COVID-19 Vaccine 08/22/2019 11:21 AM 0.3 mL 07/10/2019 Intramuscular   Manufacturer: [place]   Lot: [id number]   [place]: [id number]

## 2019-09-03 ENCOUNTER — Ambulatory Visit: Payer: Medicare Other

## 2019-09-11 ENCOUNTER — Ambulatory Visit: Payer: Medicare Other

## 2019-09-11 ENCOUNTER — Ambulatory Visit: Payer: Medicare Other | Attending: Internal Medicine

## 2019-09-11 DIAGNOSIS — Z23 Encounter for immunization: Secondary | ICD-10-CM

## 2019-09-11 NOTE — Progress Notes (Signed)
   Covid-19 Vaccination Clinic  Name:  Ann Ortega    MRN: [id number] DOB: 1945/12/08  09/11/2019  Ann Ortega was observed post Covid-19 immunization for 15 minutes without incidence. She was provided with Vaccine Information Sheet and instruction to access the V-Safe system.   Ann Ortega was instructed to call 911 with any severe reactions post vaccine: Marland Kitchen Difficulty breathing  . Swelling of your face and throat  . A fast heartbeat  . A bad rash all over your body  . Dizziness and weakness    Immunizations Administered    Name Date Dose VIS Date Route   Pfizer COVID-19 Vaccine 09/11/2019  2:16 PM 0.3 mL 07/10/2019 Intramuscular   Manufacturer: [place]   Lot: [id number]   [place]: [id number]

## 2019-12-02 DIAGNOSIS — Z6826 Body mass index (BMI) 26.0-26.9, adult: Secondary | ICD-10-CM | POA: Diagnosis not present

## 2019-12-02 DIAGNOSIS — E039 Hypothyroidism, unspecified: Secondary | ICD-10-CM | POA: Diagnosis not present

## 2019-12-02 DIAGNOSIS — K219 Gastro-esophageal reflux disease without esophagitis: Secondary | ICD-10-CM | POA: Diagnosis not present

## 2019-12-09 DIAGNOSIS — R7301 Impaired fasting glucose: Secondary | ICD-10-CM | POA: Diagnosis not present

## 2019-12-09 DIAGNOSIS — Z1389 Encounter for screening for other disorder: Secondary | ICD-10-CM | POA: Diagnosis not present

## 2019-12-09 DIAGNOSIS — K219 Gastro-esophageal reflux disease without esophagitis: Secondary | ICD-10-CM | POA: Diagnosis not present

## 2019-12-09 DIAGNOSIS — E782 Mixed hyperlipidemia: Secondary | ICD-10-CM | POA: Diagnosis not present

## 2019-12-09 DIAGNOSIS — E039 Hypothyroidism, unspecified: Secondary | ICD-10-CM | POA: Diagnosis not present

## 2019-12-09 DIAGNOSIS — Z1331 Encounter for screening for depression: Secondary | ICD-10-CM | POA: Diagnosis not present

## 2019-12-09 DIAGNOSIS — Z6826 Body mass index (BMI) 26.0-26.9, adult: Secondary | ICD-10-CM | POA: Diagnosis not present

## 2020-04-12 DIAGNOSIS — Z20828 Contact with and (suspected) exposure to other viral communicable diseases: Secondary | ICD-10-CM | POA: Diagnosis not present

## 2020-04-12 DIAGNOSIS — J069 Acute upper respiratory infection, unspecified: Secondary | ICD-10-CM | POA: Diagnosis not present

## 2020-04-12 DIAGNOSIS — H8309 Labyrinthitis, unspecified ear: Secondary | ICD-10-CM | POA: Diagnosis not present

## 2020-04-25 DIAGNOSIS — Z23 Encounter for immunization: Secondary | ICD-10-CM | POA: Diagnosis not present

## 2020-05-30 DIAGNOSIS — H0261 Xanthelasma of right upper eyelid: Secondary | ICD-10-CM | POA: Diagnosis not present

## 2020-05-30 DIAGNOSIS — H25813 Combined forms of age-related cataract, bilateral: Secondary | ICD-10-CM | POA: Diagnosis not present

## 2020-06-02 ENCOUNTER — Other Ambulatory Visit: Payer: Self-pay | Admitting: Family Medicine

## 2020-06-02 DIAGNOSIS — Z1231 Encounter for screening mammogram for malignant neoplasm of breast: Secondary | ICD-10-CM

## 2020-06-07 DIAGNOSIS — E039 Hypothyroidism, unspecified: Secondary | ICD-10-CM | POA: Diagnosis not present

## 2020-06-07 DIAGNOSIS — K219 Gastro-esophageal reflux disease without esophagitis: Secondary | ICD-10-CM | POA: Diagnosis not present

## 2020-06-07 DIAGNOSIS — R7301 Impaired fasting glucose: Secondary | ICD-10-CM | POA: Diagnosis not present

## 2020-06-07 DIAGNOSIS — E782 Mixed hyperlipidemia: Secondary | ICD-10-CM | POA: Diagnosis not present

## 2020-06-09 DIAGNOSIS — R7301 Impaired fasting glucose: Secondary | ICD-10-CM | POA: Diagnosis not present

## 2020-06-09 DIAGNOSIS — E782 Mixed hyperlipidemia: Secondary | ICD-10-CM | POA: Diagnosis not present

## 2020-06-09 DIAGNOSIS — K219 Gastro-esophageal reflux disease without esophagitis: Secondary | ICD-10-CM | POA: Diagnosis not present

## 2020-06-09 DIAGNOSIS — S46919A Strain of unspecified muscle, fascia and tendon at shoulder and upper arm level, unspecified arm, initial encounter: Secondary | ICD-10-CM | POA: Diagnosis not present

## 2020-06-09 DIAGNOSIS — Z23 Encounter for immunization: Secondary | ICD-10-CM | POA: Diagnosis not present

## 2020-06-09 DIAGNOSIS — Z6826 Body mass index (BMI) 26.0-26.9, adult: Secondary | ICD-10-CM | POA: Diagnosis not present

## 2020-06-09 DIAGNOSIS — Z0001 Encounter for general adult medical examination with abnormal findings: Secondary | ICD-10-CM | POA: Diagnosis not present

## 2020-06-09 DIAGNOSIS — E039 Hypothyroidism, unspecified: Secondary | ICD-10-CM | POA: Diagnosis not present

## 2020-06-16 ENCOUNTER — Ambulatory Visit: Payer: Medicare Other | Admitting: Physician Assistant

## 2020-07-13 ENCOUNTER — Ambulatory Visit
Admission: RE | Admit: 2020-07-13 | Discharge: 2020-07-13 | Disposition: A | Discharge status: Home / Self Care | Payer: Medicare Other | Source: Ambulatory Visit | Attending: Family Medicine | Admitting: Family Medicine

## 2020-07-13 ENCOUNTER — Other Ambulatory Visit: Payer: Self-pay

## 2020-07-13 DIAGNOSIS — Z1231 Encounter for screening mammogram for malignant neoplasm of breast: Secondary | ICD-10-CM | POA: Diagnosis not present

## 2020-10-28 DIAGNOSIS — K219 Gastro-esophageal reflux disease without esophagitis: Secondary | ICD-10-CM | POA: Diagnosis not present

## 2020-10-28 DIAGNOSIS — K5900 Constipation, unspecified: Secondary | ICD-10-CM | POA: Diagnosis not present

## 2020-10-31 DIAGNOSIS — Z23 Encounter for immunization: Secondary | ICD-10-CM | POA: Diagnosis not present

## 2020-11-02 DIAGNOSIS — Z6826 Body mass index (BMI) 26.0-26.9, adult: Secondary | ICD-10-CM | POA: Diagnosis not present

## 2020-11-02 DIAGNOSIS — L259 Unspecified contact dermatitis, unspecified cause: Secondary | ICD-10-CM | POA: Diagnosis not present

## 2020-11-28 DIAGNOSIS — H40013 Open angle with borderline findings, low risk, bilateral: Secondary | ICD-10-CM | POA: Diagnosis not present

## 2020-12-01 DIAGNOSIS — R7301 Impaired fasting glucose: Secondary | ICD-10-CM | POA: Diagnosis not present

## 2020-12-01 DIAGNOSIS — Z6826 Body mass index (BMI) 26.0-26.9, adult: Secondary | ICD-10-CM | POA: Diagnosis not present

## 2020-12-01 DIAGNOSIS — E782 Mixed hyperlipidemia: Secondary | ICD-10-CM | POA: Diagnosis not present

## 2020-12-01 DIAGNOSIS — K219 Gastro-esophageal reflux disease without esophagitis: Secondary | ICD-10-CM | POA: Diagnosis not present

## 2020-12-01 DIAGNOSIS — E039 Hypothyroidism, unspecified: Secondary | ICD-10-CM | POA: Diagnosis not present

## 2020-12-06 DIAGNOSIS — E7849 Other hyperlipidemia: Secondary | ICD-10-CM | POA: Diagnosis not present

## 2020-12-06 DIAGNOSIS — M1710 Unilateral primary osteoarthritis, unspecified knee: Secondary | ICD-10-CM | POA: Diagnosis not present

## 2020-12-06 DIAGNOSIS — K219 Gastro-esophageal reflux disease without esophagitis: Secondary | ICD-10-CM | POA: Diagnosis not present

## 2020-12-06 DIAGNOSIS — E039 Hypothyroidism, unspecified: Secondary | ICD-10-CM | POA: Diagnosis not present

## 2020-12-06 DIAGNOSIS — Z6825 Body mass index (BMI) 25.0-25.9, adult: Secondary | ICD-10-CM | POA: Diagnosis not present

## 2020-12-06 DIAGNOSIS — S46919A Strain of unspecified muscle, fascia and tendon at shoulder and upper arm level, unspecified arm, initial encounter: Secondary | ICD-10-CM | POA: Diagnosis not present

## 2020-12-06 DIAGNOSIS — R7301 Impaired fasting glucose: Secondary | ICD-10-CM | POA: Diagnosis not present

## 2020-12-25 IMAGING — MG DIGITAL SCREENING BILAT W/ TOMO W/ CAD
6 of 10 series · 6 of 30 positions shown · non-contrast
Comparison: Previous exam(s).

ACR Breast Density Category a: The breast tissue is almost entirely
fatty.

CLINICAL DATA: Screening.

EXAM:
DIGITAL SCREENING BILATERAL MAMMOGRAM WITH TOMO AND CAD

[R MLO synth-2D]
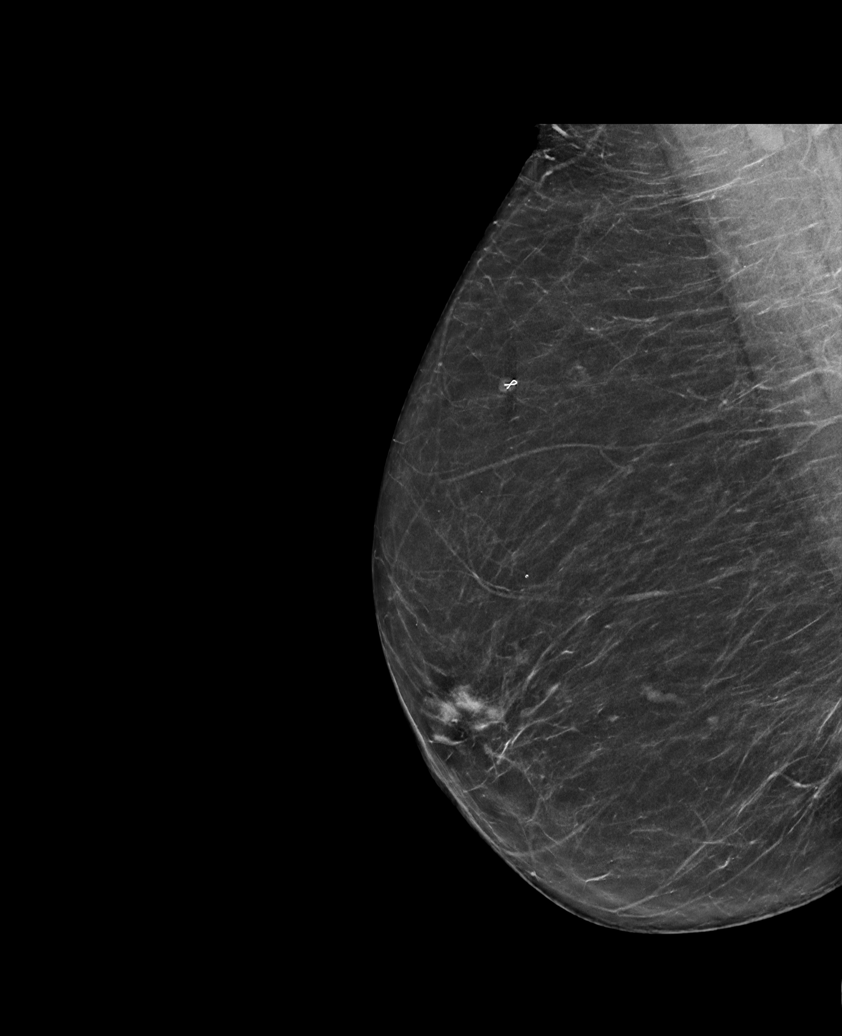

[L MLO synth-2D (1 of 2)]
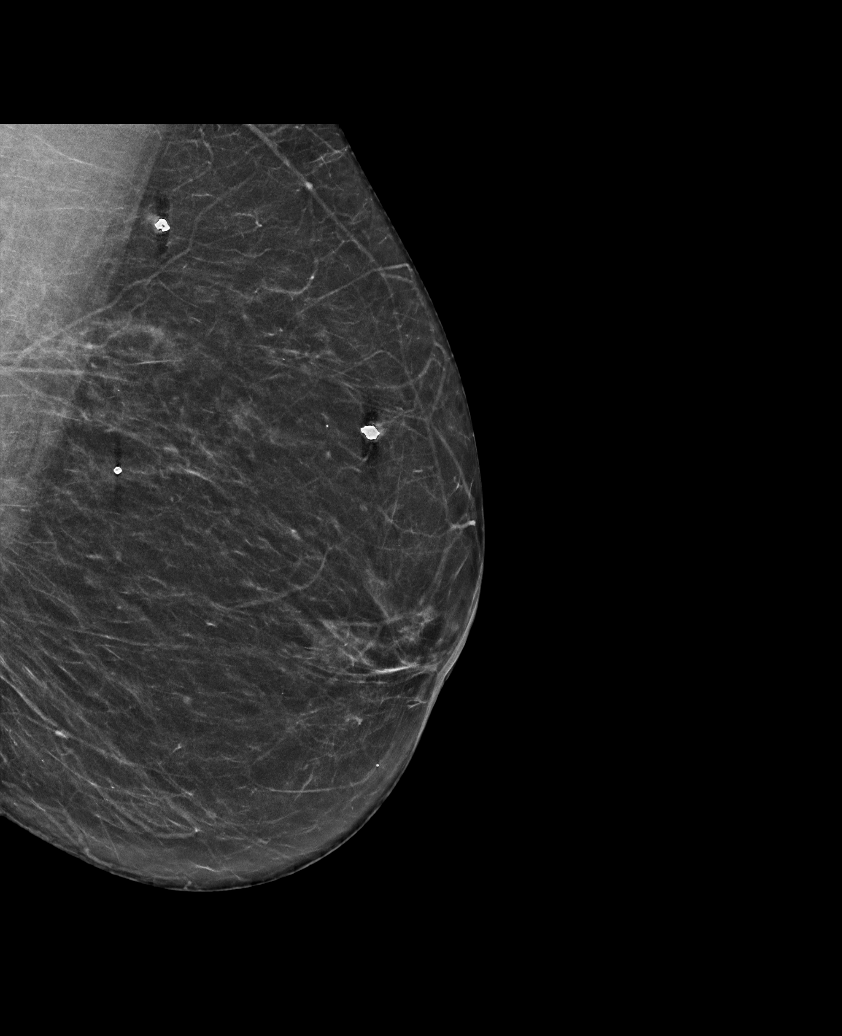

[L MLO synth-2D (2 of 2)]
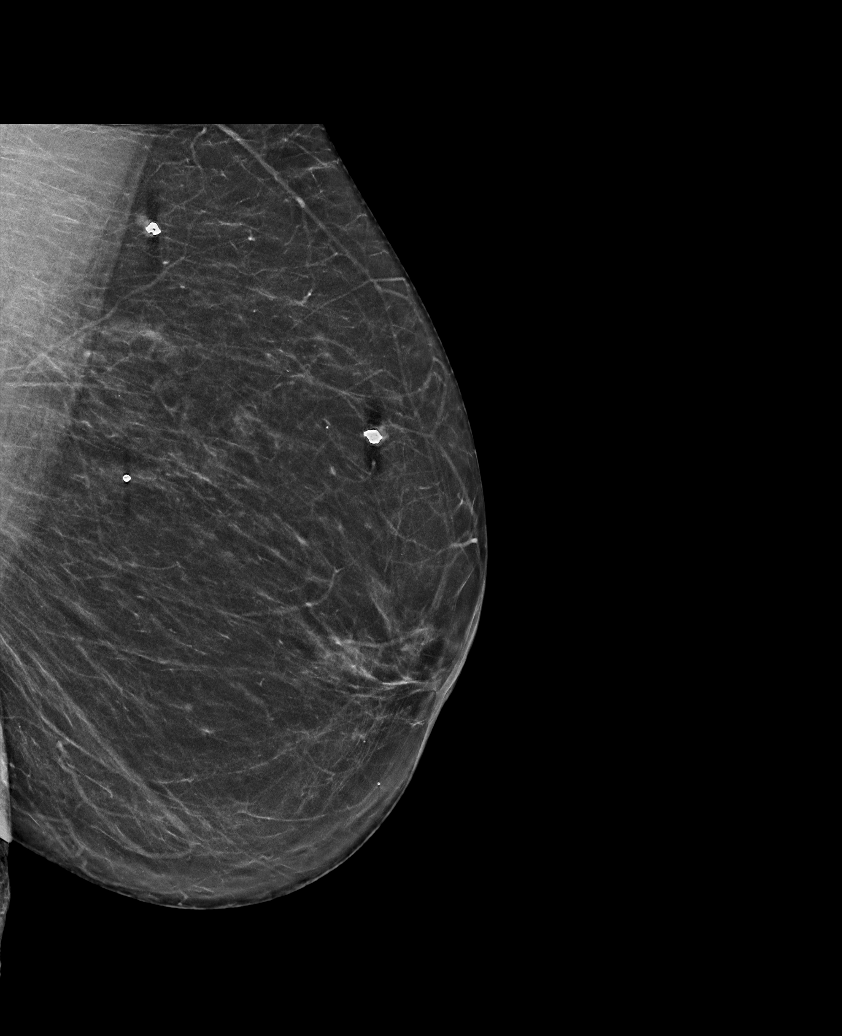

[L CC synth-2D]
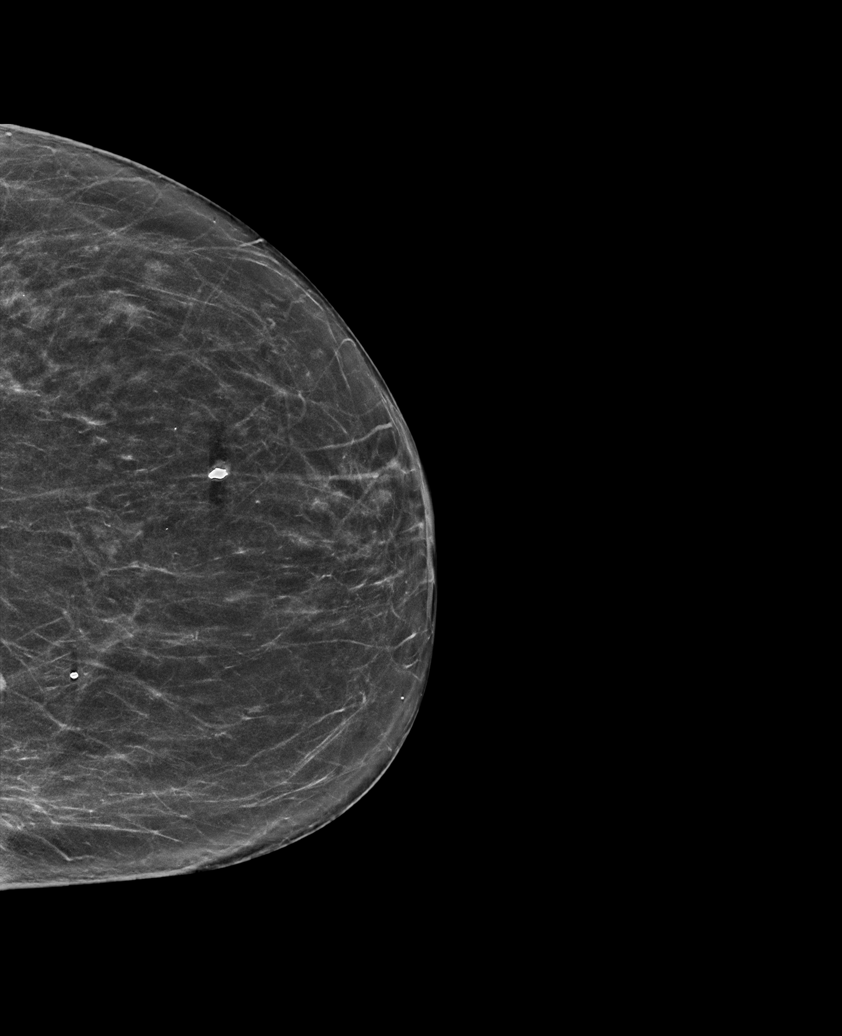

[R CC synth-2D]
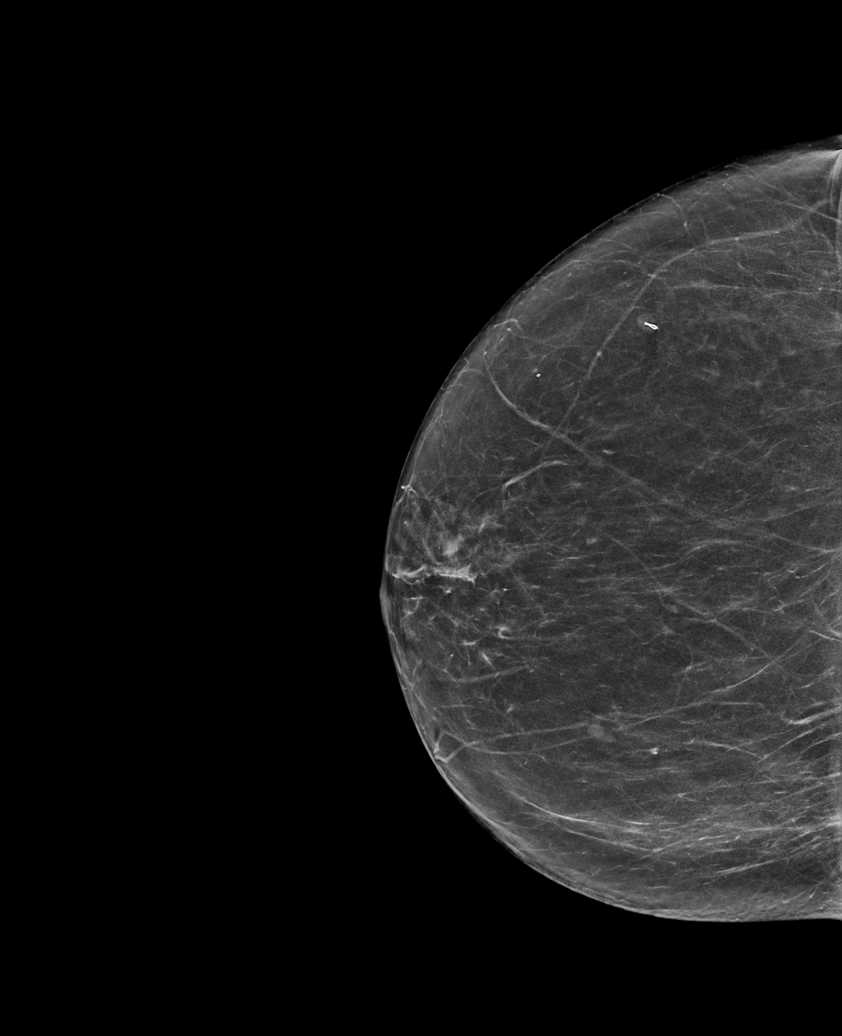

[L MLO tomo · tomo slice 34/67.0]
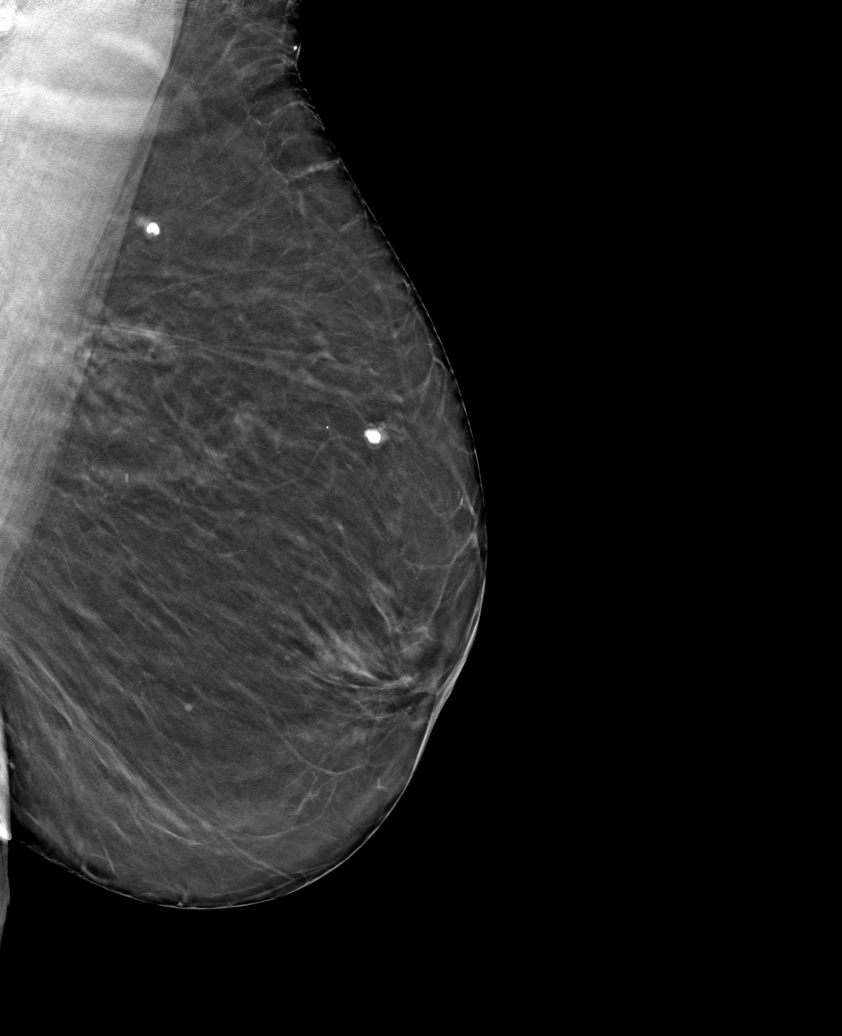

[6 of 30 positions shown; findings below may reference images not displayed]

FINDINGS: There are no findings suspicious for malignancy. Images were
processed with CAD.
IMPRESSION: No mammographic evidence of malignancy. A result letter of this
screening mammogram will be mailed directly to the patient.

RECOMMENDATION:
Screening mammogram in one year. (Code:8Y-[id number])

BI-RADS CATEGORY  1: Negative.

## 2021-01-11 DIAGNOSIS — Z20822 Contact with and (suspected) exposure to covid-19: Secondary | ICD-10-CM | POA: Diagnosis not present

## 2021-04-23 DIAGNOSIS — Z23 Encounter for immunization: Secondary | ICD-10-CM | POA: Diagnosis not present

## 2021-06-01 DIAGNOSIS — H40013 Open angle with borderline findings, low risk, bilateral: Secondary | ICD-10-CM | POA: Diagnosis not present

## 2021-06-08 DIAGNOSIS — E782 Mixed hyperlipidemia: Secondary | ICD-10-CM | POA: Diagnosis not present

## 2021-06-08 DIAGNOSIS — E039 Hypothyroidism, unspecified: Secondary | ICD-10-CM | POA: Diagnosis not present

## 2021-06-08 DIAGNOSIS — R7301 Impaired fasting glucose: Secondary | ICD-10-CM | POA: Diagnosis not present

## 2021-06-08 DIAGNOSIS — K219 Gastro-esophageal reflux disease without esophagitis: Secondary | ICD-10-CM | POA: Diagnosis not present

## 2021-06-08 DIAGNOSIS — E7849 Other hyperlipidemia: Secondary | ICD-10-CM | POA: Diagnosis not present

## 2021-06-12 DIAGNOSIS — E7849 Other hyperlipidemia: Secondary | ICD-10-CM | POA: Diagnosis not present

## 2021-06-12 DIAGNOSIS — M7550 Bursitis of unspecified shoulder: Secondary | ICD-10-CM | POA: Diagnosis not present

## 2021-06-12 DIAGNOSIS — E039 Hypothyroidism, unspecified: Secondary | ICD-10-CM | POA: Diagnosis not present

## 2021-06-12 DIAGNOSIS — M1710 Unilateral primary osteoarthritis, unspecified knee: Secondary | ICD-10-CM | POA: Diagnosis not present

## 2021-06-12 DIAGNOSIS — R7301 Impaired fasting glucose: Secondary | ICD-10-CM | POA: Diagnosis not present

## 2021-06-12 DIAGNOSIS — K219 Gastro-esophageal reflux disease without esophagitis: Secondary | ICD-10-CM | POA: Diagnosis not present

## 2021-06-30 ENCOUNTER — Other Ambulatory Visit: Payer: Self-pay | Admitting: Family Medicine

## 2021-06-30 DIAGNOSIS — Z1231 Encounter for screening mammogram for malignant neoplasm of breast: Secondary | ICD-10-CM

## 2021-07-07 ENCOUNTER — Ambulatory Visit
Admission: RE | Admit: 2021-07-07 | Discharge: 2021-07-07 | Disposition: A | Discharge status: Home / Self Care | Payer: Medicare Other | Source: Ambulatory Visit | Attending: Family Medicine | Admitting: Family Medicine

## 2021-07-07 ENCOUNTER — Other Ambulatory Visit: Payer: Self-pay

## 2021-07-07 DIAGNOSIS — Z1231 Encounter for screening mammogram for malignant neoplasm of breast: Secondary | ICD-10-CM

## 2021-07-10 DIAGNOSIS — M545 Low back pain, unspecified: Secondary | ICD-10-CM | POA: Diagnosis not present

## 2021-07-18 ENCOUNTER — Inpatient Hospital Stay: Admission: RE | Admit: 2021-07-18 | Payer: Medicare Other | Source: Ambulatory Visit

## 2021-07-19 ENCOUNTER — Ambulatory Visit: Payer: Medicare Other

## 2021-07-27 ENCOUNTER — Ambulatory Visit
Admission: RE | Admit: 2021-07-27 | Discharge: 2021-07-27 | Disposition: A | Discharge status: Home / Self Care | Payer: Medicare Other | Source: Ambulatory Visit | Attending: Family Medicine | Admitting: Family Medicine

## 2021-11-30 DIAGNOSIS — H40013 Open angle with borderline findings, low risk, bilateral: Secondary | ICD-10-CM | POA: Diagnosis not present

## 2021-12-06 DIAGNOSIS — K219 Gastro-esophageal reflux disease without esophagitis: Secondary | ICD-10-CM | POA: Diagnosis not present

## 2021-12-06 DIAGNOSIS — E7849 Other hyperlipidemia: Secondary | ICD-10-CM | POA: Diagnosis not present

## 2021-12-06 DIAGNOSIS — E039 Hypothyroidism, unspecified: Secondary | ICD-10-CM | POA: Diagnosis not present

## 2021-12-06 DIAGNOSIS — E782 Mixed hyperlipidemia: Secondary | ICD-10-CM | POA: Diagnosis not present

## 2021-12-06 DIAGNOSIS — R7301 Impaired fasting glucose: Secondary | ICD-10-CM | POA: Diagnosis not present

## 2021-12-12 DIAGNOSIS — I10 Essential (primary) hypertension: Secondary | ICD-10-CM | POA: Diagnosis not present

## 2021-12-12 DIAGNOSIS — E7849 Other hyperlipidemia: Secondary | ICD-10-CM | POA: Diagnosis not present

## 2021-12-12 DIAGNOSIS — M1710 Unilateral primary osteoarthritis, unspecified knee: Secondary | ICD-10-CM | POA: Diagnosis not present

## 2021-12-12 DIAGNOSIS — Z1331 Encounter for screening for depression: Secondary | ICD-10-CM | POA: Diagnosis not present

## 2021-12-12 DIAGNOSIS — M7550 Bursitis of unspecified shoulder: Secondary | ICD-10-CM | POA: Diagnosis not present

## 2021-12-12 DIAGNOSIS — E039 Hypothyroidism, unspecified: Secondary | ICD-10-CM | POA: Diagnosis not present

## 2021-12-12 DIAGNOSIS — R7301 Impaired fasting glucose: Secondary | ICD-10-CM | POA: Diagnosis not present

## 2021-12-12 DIAGNOSIS — Z1389 Encounter for screening for other disorder: Secondary | ICD-10-CM | POA: Diagnosis not present

## 2022-02-19 ENCOUNTER — Ambulatory Visit (INDEPENDENT_AMBULATORY_CARE_PROVIDER_SITE_OTHER): Payer: Medicare Other | Admitting: Orthopedic Surgery

## 2022-02-19 ENCOUNTER — Encounter: Payer: Self-pay | Admitting: Orthopedic Surgery

## 2022-02-19 ENCOUNTER — Ambulatory Visit (INDEPENDENT_AMBULATORY_CARE_PROVIDER_SITE_OTHER): Payer: Medicare Other

## 2022-02-19 VITALS — BP 148/75 | HR 64 | Ht 62.0 in | Wt 138.0 lb

## 2022-02-19 DIAGNOSIS — G8929 Other chronic pain: Secondary | ICD-10-CM

## 2022-02-19 DIAGNOSIS — M25562 Pain in left knee: Secondary | ICD-10-CM | POA: Diagnosis not present

## 2022-02-19 DIAGNOSIS — M1712 Unilateral primary osteoarthritis, left knee: Secondary | ICD-10-CM | POA: Diagnosis not present

## 2022-02-19 DIAGNOSIS — M1710 Unilateral primary osteoarthritis, unspecified knee: Secondary | ICD-10-CM

## 2022-02-19 MED ORDER — PREDNISONE 10 MG PO TABS: 10.0000 mg | ORAL_TABLET | Freq: Two times a day (BID) | ORAL | 0 refills | Status: AC

## 2022-02-19 NOTE — Progress Notes (Signed)
Chief Complaint  Patient presents with   Knee Pain    Left, swelling and pain if she walks too much, getting ready to go on a cruise in 31 days    76 year old female complains of pain around her left knee especially when she is walking in the store and she says it swells no trauma seen years ago for her left knee  Her system review related to the knee no catching locking or giving way no erythema  Scheduled to go on a cruise in 4 days  Constitutional: Vital signs, BP (!) 148/75   Pulse 64   Ht '5\' 2"'$  (1.575 m)   Wt 138 lb (62.6 kg)   BMI 25.24 kg/m  General appearance normal no deformities  Normal cardiovascular exam   Left knee Skin normal Medial and lateral enderness no effusion Range of motion is good actually Knee is stable Strength is normal  Neuro coordination, deep tendon reflexes, sensation  Psych alert and oriented x3, depression anxiety agitation  Musculoskeletal gait normal no assistive device no limp  X-ray grade 3 arthritis left knee some sclerosis subchondral bone there is a cyst on the medial central tibia   Encounter Diagnoses  Name Primary?   Chronic pain of left knee Yes   Primary localized osteoarthritis of knee-left     Procedure note left knee injection   verbal consent was obtained to inject left knee joint  Timeout was completed to confirm the site of injection  The medications used were depomedrol 40 mg and 1% lidocaine 3 cc Anesthesia was provided by ethyl chloride and the skin was prepped with alcohol.  After cleaning the skin with alcohol a 20-gauge needle was used to inject the left knee joint. There were no complications. A sterile bandage was applied.    Meds ordered this encounter  Medications   predniSONE (DELTASONE) 10 MG tablet    Sig: Take 1 tablet (10 mg total) by mouth 2 (two) times daily with a meal for 14 days.    Dispense:  28 tablet    Refill:  0

## 2022-03-05 DIAGNOSIS — R030 Elevated blood-pressure reading, without diagnosis of hypertension: Secondary | ICD-10-CM | POA: Diagnosis not present

## 2022-03-05 DIAGNOSIS — R059 Cough, unspecified: Secondary | ICD-10-CM | POA: Diagnosis not present

## 2022-03-05 DIAGNOSIS — Z20828 Contact with and (suspected) exposure to other viral communicable diseases: Secondary | ICD-10-CM | POA: Diagnosis not present

## 2022-05-08 DIAGNOSIS — K219 Gastro-esophageal reflux disease without esophagitis: Secondary | ICD-10-CM | POA: Diagnosis not present

## 2022-05-08 DIAGNOSIS — Z23 Encounter for immunization: Secondary | ICD-10-CM | POA: Diagnosis not present

## 2022-05-08 DIAGNOSIS — Z86010 Personal history of colonic polyps: Secondary | ICD-10-CM | POA: Diagnosis not present

## 2022-05-08 DIAGNOSIS — K5900 Constipation, unspecified: Secondary | ICD-10-CM | POA: Diagnosis not present

## 2022-05-16 DIAGNOSIS — K648 Other hemorrhoids: Secondary | ICD-10-CM | POA: Diagnosis not present

## 2022-05-16 DIAGNOSIS — Z86010 Personal history of colonic polyps: Secondary | ICD-10-CM | POA: Diagnosis not present

## 2022-05-16 DIAGNOSIS — K635 Polyp of colon: Secondary | ICD-10-CM | POA: Diagnosis not present

## 2022-05-16 DIAGNOSIS — Z09 Encounter for follow-up examination after completed treatment for conditions other than malignant neoplasm: Secondary | ICD-10-CM | POA: Diagnosis not present

## 2022-05-18 DIAGNOSIS — K635 Polyp of colon: Secondary | ICD-10-CM | POA: Diagnosis not present

## 2022-05-26 DIAGNOSIS — Z23 Encounter for immunization: Secondary | ICD-10-CM | POA: Diagnosis not present

## 2022-06-05 DIAGNOSIS — H40013 Open angle with borderline findings, low risk, bilateral: Secondary | ICD-10-CM | POA: Diagnosis not present

## 2022-06-08 DIAGNOSIS — E039 Hypothyroidism, unspecified: Secondary | ICD-10-CM | POA: Diagnosis not present

## 2022-06-08 DIAGNOSIS — E7849 Other hyperlipidemia: Secondary | ICD-10-CM | POA: Diagnosis not present

## 2022-06-08 DIAGNOSIS — R7301 Impaired fasting glucose: Secondary | ICD-10-CM | POA: Diagnosis not present

## 2022-06-25 DIAGNOSIS — R7301 Impaired fasting glucose: Secondary | ICD-10-CM | POA: Diagnosis not present

## 2022-06-25 DIAGNOSIS — M7550 Bursitis of unspecified shoulder: Secondary | ICD-10-CM | POA: Diagnosis not present

## 2022-06-25 DIAGNOSIS — K219 Gastro-esophageal reflux disease without esophagitis: Secondary | ICD-10-CM | POA: Diagnosis not present

## 2022-06-25 DIAGNOSIS — Z6825 Body mass index (BMI) 25.0-25.9, adult: Secondary | ICD-10-CM | POA: Diagnosis not present

## 2022-06-25 DIAGNOSIS — M1710 Unilateral primary osteoarthritis, unspecified knee: Secondary | ICD-10-CM | POA: Diagnosis not present

## 2022-06-25 DIAGNOSIS — E039 Hypothyroidism, unspecified: Secondary | ICD-10-CM | POA: Diagnosis not present

## 2022-06-25 DIAGNOSIS — E7849 Other hyperlipidemia: Secondary | ICD-10-CM | POA: Diagnosis not present

## 2022-06-25 DIAGNOSIS — R030 Elevated blood-pressure reading, without diagnosis of hypertension: Secondary | ICD-10-CM | POA: Diagnosis not present

## 2022-07-12 ENCOUNTER — Ambulatory Visit (INDEPENDENT_AMBULATORY_CARE_PROVIDER_SITE_OTHER): Payer: Medicare Other | Admitting: Orthopedic Surgery

## 2022-07-12 DIAGNOSIS — M1712 Unilateral primary osteoarthritis, left knee: Secondary | ICD-10-CM | POA: Diagnosis not present

## 2022-07-12 DIAGNOSIS — M1710 Unilateral primary osteoarthritis, unspecified knee: Secondary | ICD-10-CM

## 2022-07-12 MED ORDER — METHYLPREDNISOLONE ACETATE 40 MG/ML IJ SUSP
40.0000 mg | Freq: Once | INTRAMUSCULAR | Status: AC
  Administered 2022-07-12: 40 mg via INTRA_ARTICULAR

## 2022-07-12 NOTE — Patient Instructions (Signed)

## 2022-07-12 NOTE — Progress Notes (Signed)
Chief Complaint  Patient presents with   Follow-up    Recheck on left knee.    Encounter Diagnosis  Name Primary?   Primary localized osteoarthritis of knee-left Yes   76 year old female with chronic pain from osteoarthritis left knee requested knee injection  Procedure note left knee injection   verbal consent was obtained to inject left knee joint  Timeout was completed to confirm the site of injection  The medications used were depomedrol 40 mg and 1% lidocaine 3 cc Anesthesia was provided by ethyl chloride and the skin was prepped with alcohol.  After cleaning the skin with alcohol a 20-gauge needle was used to inject the left knee joint. There were no complications. A sterile bandage was applied.

## 2022-08-28 DIAGNOSIS — Z6825 Body mass index (BMI) 25.0-25.9, adult: Secondary | ICD-10-CM | POA: Diagnosis not present

## 2022-08-28 DIAGNOSIS — J069 Acute upper respiratory infection, unspecified: Secondary | ICD-10-CM | POA: Diagnosis not present

## 2022-08-28 DIAGNOSIS — R030 Elevated blood-pressure reading, without diagnosis of hypertension: Secondary | ICD-10-CM | POA: Diagnosis not present

## 2022-08-28 DIAGNOSIS — Z20828 Contact with and (suspected) exposure to other viral communicable diseases: Secondary | ICD-10-CM | POA: Diagnosis not present

## 2022-09-27 ENCOUNTER — Encounter: Payer: Self-pay | Admitting: Radiology

## 2022-11-26 ENCOUNTER — Encounter: Payer: Self-pay | Admitting: Orthopedic Surgery

## 2022-11-26 ENCOUNTER — Ambulatory Visit (INDEPENDENT_AMBULATORY_CARE_PROVIDER_SITE_OTHER): Payer: Medicare Other | Admitting: Orthopedic Surgery

## 2022-11-26 DIAGNOSIS — M25562 Pain in left knee: Secondary | ICD-10-CM

## 2022-11-26 DIAGNOSIS — G8929 Other chronic pain: Secondary | ICD-10-CM

## 2022-11-26 DIAGNOSIS — M1710 Unilateral primary osteoarthritis, unspecified knee: Secondary | ICD-10-CM

## 2022-11-26 MED ORDER — METHYLPREDNISOLONE ACETATE 40 MG/ML IJ SUSP
40.0000 mg | Freq: Once | INTRAMUSCULAR | Status: AC
Start: 2022-11-26 — End: 2022-11-26
  Administered 2022-11-26: 40 mg via INTRA_ARTICULAR

## 2022-11-26 NOTE — Progress Notes (Signed)
   The patient has requested an injection   Chief Complaint  Patient presents with   Injections    LT knee     Encounter Diagnoses  Name Primary?   Primary localized osteoarthritis of knee-left Yes   Chronic pain of left knee         After appropriate timeout for site confirmation medication confirmation,  The left was prepped with alcohol and ethyl chloride spray.  She had an effusion I attempted aspiration it was unsuccessful I chose another spot attempted aspiration again again unsuccessful therefore just injected the knee   Medication Depomedrol 40 mg and 1% lidocaine plain   There were no complications  The patient was observed for any reactions there were none and the patient was discharged.

## 2022-11-26 NOTE — Patient Instructions (Signed)

## 2022-12-04 DIAGNOSIS — H40013 Open angle with borderline findings, low risk, bilateral: Secondary | ICD-10-CM | POA: Diagnosis not present

## 2022-12-17 DIAGNOSIS — E7849 Other hyperlipidemia: Secondary | ICD-10-CM | POA: Diagnosis not present

## 2022-12-17 DIAGNOSIS — R7301 Impaired fasting glucose: Secondary | ICD-10-CM | POA: Diagnosis not present

## 2022-12-17 DIAGNOSIS — E039 Hypothyroidism, unspecified: Secondary | ICD-10-CM | POA: Diagnosis not present

## 2022-12-26 DIAGNOSIS — M1710 Unilateral primary osteoarthritis, unspecified knee: Secondary | ICD-10-CM | POA: Diagnosis not present

## 2022-12-26 DIAGNOSIS — R030 Elevated blood-pressure reading, without diagnosis of hypertension: Secondary | ICD-10-CM | POA: Diagnosis not present

## 2022-12-26 DIAGNOSIS — Z23 Encounter for immunization: Secondary | ICD-10-CM | POA: Diagnosis not present

## 2022-12-26 DIAGNOSIS — E039 Hypothyroidism, unspecified: Secondary | ICD-10-CM | POA: Diagnosis not present

## 2022-12-26 DIAGNOSIS — E7849 Other hyperlipidemia: Secondary | ICD-10-CM | POA: Diagnosis not present

## 2022-12-26 DIAGNOSIS — Z6826 Body mass index (BMI) 26.0-26.9, adult: Secondary | ICD-10-CM | POA: Diagnosis not present

## 2022-12-26 DIAGNOSIS — R7301 Impaired fasting glucose: Secondary | ICD-10-CM | POA: Diagnosis not present

## 2022-12-26 DIAGNOSIS — Z0001 Encounter for general adult medical examination with abnormal findings: Secondary | ICD-10-CM | POA: Diagnosis not present

## 2022-12-26 DIAGNOSIS — R1310 Dysphagia, unspecified: Secondary | ICD-10-CM | POA: Diagnosis not present

## 2022-12-26 DIAGNOSIS — M7550 Bursitis of unspecified shoulder: Secondary | ICD-10-CM | POA: Diagnosis not present

## 2022-12-26 DIAGNOSIS — K219 Gastro-esophageal reflux disease without esophagitis: Secondary | ICD-10-CM | POA: Diagnosis not present

## 2023-01-28 DIAGNOSIS — J40 Bronchitis, not specified as acute or chronic: Secondary | ICD-10-CM | POA: Diagnosis not present

## 2023-01-28 DIAGNOSIS — Z20828 Contact with and (suspected) exposure to other viral communicable diseases: Secondary | ICD-10-CM | POA: Diagnosis not present

## 2023-01-28 DIAGNOSIS — J329 Chronic sinusitis, unspecified: Secondary | ICD-10-CM | POA: Diagnosis not present

## 2023-01-28 DIAGNOSIS — R030 Elevated blood-pressure reading, without diagnosis of hypertension: Secondary | ICD-10-CM | POA: Diagnosis not present

## 2023-01-28 DIAGNOSIS — Z6825 Body mass index (BMI) 25.0-25.9, adult: Secondary | ICD-10-CM | POA: Diagnosis not present

## 2023-03-11 ENCOUNTER — Ambulatory Visit: Payer: Medicare Other | Admitting: Orthopedic Surgery

## 2023-03-12 ENCOUNTER — Encounter: Payer: Self-pay | Admitting: Orthopaedic Surgery

## 2023-03-12 ENCOUNTER — Ambulatory Visit (INDEPENDENT_AMBULATORY_CARE_PROVIDER_SITE_OTHER): Payer: Medicare Other | Admitting: Orthopaedic Surgery

## 2023-03-12 DIAGNOSIS — M25562 Pain in left knee: Secondary | ICD-10-CM

## 2023-03-12 DIAGNOSIS — G8929 Other chronic pain: Secondary | ICD-10-CM | POA: Diagnosis not present

## 2023-03-12 DIAGNOSIS — M1712 Unilateral primary osteoarthritis, left knee: Secondary | ICD-10-CM

## 2023-03-12 DIAGNOSIS — M1710 Unilateral primary osteoarthritis, unspecified knee: Secondary | ICD-10-CM

## 2023-03-12 MED ORDER — METHYLPREDNISOLONE ACETATE 40 MG/ML IJ SUSP
40.0000 mg | Freq: Once | INTRAMUSCULAR | Status: AC
Start: 2023-03-12 — End: 2023-03-12
  Administered 2023-03-12: 40 mg via INTRA_ARTICULAR

## 2023-03-12 NOTE — Progress Notes (Signed)
PROCEDURE NOTE:  The patient requests injections of the left knee , verbal consent was obtained.  The left knee was prepped appropriately after time out was performed.   Sterile technique was observed and injection of 1 cc of DepoMedrol 40 mg with several cc's of plain xylocaine. Anesthesia was provided by ethyl chloride and a 20-gauge needle was used to inject the knee area. The injection was tolerated well.  A band aid dressing was applied.  The patient was advised to apply ice later today and tomorrow to the injection sight as needed.  Encounter Diagnoses  Name Primary?   Primary localized osteoarthritis of knee-left Yes   Chronic pain of left knee    Return prn.  Call if any problem.  Precautions discussed.  Electronically Signed Darreld Mclean, MD 8/13/202410:21 AM

## 2023-03-29 ENCOUNTER — Ambulatory Visit: Payer: Medicare Other | Admitting: Orthopedic Surgery

## 2023-06-06 DIAGNOSIS — H40013 Open angle with borderline findings, low risk, bilateral: Secondary | ICD-10-CM | POA: Diagnosis not present

## 2023-06-14 ENCOUNTER — Ambulatory Visit (INDEPENDENT_AMBULATORY_CARE_PROVIDER_SITE_OTHER): Payer: Medicare Other | Admitting: Orthopedic Surgery

## 2023-06-14 DIAGNOSIS — M1712 Unilateral primary osteoarthritis, left knee: Secondary | ICD-10-CM | POA: Diagnosis not present

## 2023-06-14 DIAGNOSIS — G8929 Other chronic pain: Secondary | ICD-10-CM

## 2023-06-14 DIAGNOSIS — M1710 Unilateral primary osteoarthritis, unspecified knee: Secondary | ICD-10-CM

## 2023-06-14 MED ORDER — METHYLPREDNISOLONE ACETATE 40 MG/ML IJ SUSP
40.0000 mg | Freq: Once | INTRAMUSCULAR | Status: AC
  Administered 2023-06-14: 40 mg via INTRA_ARTICULAR

## 2023-06-14 NOTE — Progress Notes (Signed)
Chief Complaint  Patient presents with   Knee Pain    Inj left knee      Encounter Diagnoses  Name Primary?   Primary localized osteoarthritis of knee-left Yes   Chronic pain of left knee     Procedure note left knee injection   verbal consent was obtained to inject left knee joint  Timeout was completed to confirm the site of injection  The medications used were depomedrol 40 mg and 1% lidocaine 3 cc Anesthesia was provided by ethyl chloride and the skin was prepped with alcohol.  After cleaning the skin with alcohol a 20-gauge needle was used to inject the left knee joint. There were no complications. A sterile bandage was applied.

## 2023-07-29 DIAGNOSIS — R059 Cough, unspecified: Secondary | ICD-10-CM | POA: Diagnosis not present

## 2023-07-29 DIAGNOSIS — Z6825 Body mass index (BMI) 25.0-25.9, adult: Secondary | ICD-10-CM | POA: Diagnosis not present

## 2023-07-29 DIAGNOSIS — Z20828 Contact with and (suspected) exposure to other viral communicable diseases: Secondary | ICD-10-CM | POA: Diagnosis not present

## 2023-07-29 DIAGNOSIS — R509 Fever, unspecified: Secondary | ICD-10-CM | POA: Diagnosis not present

## 2023-07-29 DIAGNOSIS — R030 Elevated blood-pressure reading, without diagnosis of hypertension: Secondary | ICD-10-CM | POA: Diagnosis not present

## 2023-10-23 ENCOUNTER — Telehealth: Payer: Self-pay | Admitting: Orthopedic Surgery

## 2023-10-23 NOTE — Telephone Encounter (Signed)
 Patient presented to the office wanting to schedule an injection for her lt knee now and then again by 6/26 because she is going on a cruise 6/27.  Your first available opening is 4/03 and that will put her less than 3 months out for her next injection.  Please advise.

## 2023-10-25 ENCOUNTER — Ambulatory Visit: Admitting: Orthopedic Surgery

## 2023-10-25 DIAGNOSIS — G8929 Other chronic pain: Secondary | ICD-10-CM

## 2023-10-25 DIAGNOSIS — M1712 Unilateral primary osteoarthritis, left knee: Secondary | ICD-10-CM

## 2023-10-25 DIAGNOSIS — M1710 Unilateral primary osteoarthritis, unspecified knee: Secondary | ICD-10-CM

## 2023-10-25 MED ORDER — METHYLPREDNISOLONE ACETATE 40 MG/ML IJ SUSP
40.0000 mg | Freq: Once | INTRAMUSCULAR | Status: AC
  Administered 2023-10-25: 40 mg via INTRA_ARTICULAR

## 2023-10-25 NOTE — Progress Notes (Signed)
 Left knee injection for 3 weeks

## 2023-10-25 NOTE — Progress Notes (Signed)
 Chief Complaint  Patient presents with   Knee Pain    Left knee pain    Encounter Diagnoses  Name Primary?   Primary localized osteoarthritis of knee-left Yes   Chronic pain of left knee     Procedure note left knee injection   verbal consent was obtained to inject left knee joint  Timeout was completed to confirm the site of injection  The medications used were depomedrol 40 mg and 1% lidocaine 3 cc Anesthesia was provided by ethyl chloride and the skin was prepped with alcohol.  After cleaning the skin with alcohol a 20-gauge needle was used to inject the left knee joint. There were no complications. A sterile bandage was applied.

## 2023-12-16 DIAGNOSIS — H40013 Open angle with borderline findings, low risk, bilateral: Secondary | ICD-10-CM | POA: Diagnosis not present

## 2024-01-08 DIAGNOSIS — Z860100 Personal history of colon polyps, unspecified: Secondary | ICD-10-CM | POA: Insufficient documentation

## 2024-01-08 DIAGNOSIS — R140 Abdominal distension (gaseous): Secondary | ICD-10-CM | POA: Insufficient documentation

## 2024-01-08 DIAGNOSIS — K5900 Constipation, unspecified: Secondary | ICD-10-CM | POA: Insufficient documentation

## 2024-01-08 DIAGNOSIS — K219 Gastro-esophageal reflux disease without esophagitis: Secondary | ICD-10-CM | POA: Insufficient documentation

## 2024-01-09 ENCOUNTER — Encounter: Payer: Self-pay | Admitting: Orthopedic Surgery

## 2024-01-09 ENCOUNTER — Ambulatory Visit (INDEPENDENT_AMBULATORY_CARE_PROVIDER_SITE_OTHER): Admitting: Orthopedic Surgery

## 2024-01-09 DIAGNOSIS — M1712 Unilateral primary osteoarthritis, left knee: Secondary | ICD-10-CM | POA: Diagnosis not present

## 2024-01-09 MED ORDER — METHYLPREDNISOLONE ACETATE 40 MG/ML IJ SUSP
40.0000 mg | Freq: Once | INTRAMUSCULAR | Status: AC
  Administered 2024-01-09: 40 mg via INTRA_ARTICULAR

## 2024-01-09 NOTE — Addendum Note (Signed)
 Addended byArla Lab on: 01/09/2024 11:08 AM   Modules accepted: Orders

## 2024-01-09 NOTE — Progress Notes (Signed)
 Chief Complaint  Patient presents with   Injections    Left knee     Encounter Diagnosis  Name Primary?   Primary osteoarthritis of left knee Yes    Ann Ortega has requested injection left knee I do not want any surgery  Chronic left knee pain patient requests injection  Procedure note left knee injection   verbal consent was obtained to inject left knee joint  Timeout was completed to confirm the site of injection  The medications used were depomedrol 40 mg and 1% lidocaine 3 cc Anesthesia was provided by ethyl chloride and the skin was prepped with alcohol .  After cleaning the skin with alcohol  a 20-gauge needle was used to inject the left knee joint. There were no complications. A sterile bandage was applied.

## 2024-01-10 ENCOUNTER — Ambulatory Visit: Admitting: Orthopedic Surgery

## 2024-03-05 ENCOUNTER — Other Ambulatory Visit (INDEPENDENT_AMBULATORY_CARE_PROVIDER_SITE_OTHER): Payer: Self-pay

## 2024-03-05 ENCOUNTER — Ambulatory Visit: Admitting: Orthopedic Surgery

## 2024-03-05 ENCOUNTER — Encounter: Payer: Self-pay | Admitting: Orthopedic Surgery

## 2024-03-05 VITALS — BP 152/84 | HR 78 | Ht 62.0 in | Wt 138.0 lb

## 2024-03-05 DIAGNOSIS — M1711 Unilateral primary osteoarthritis, right knee: Secondary | ICD-10-CM | POA: Diagnosis not present

## 2024-03-05 DIAGNOSIS — M7051 Other bursitis of knee, right knee: Secondary | ICD-10-CM

## 2024-03-05 DIAGNOSIS — M25561 Pain in right knee: Secondary | ICD-10-CM

## 2024-03-05 MED ORDER — METHYLPREDNISOLONE ACETATE 40 MG/ML IJ SUSP
40.0000 mg | Freq: Once | INTRAMUSCULAR | Status: AC
  Administered 2024-03-05: 40 mg via INTRA_ARTICULAR

## 2024-03-05 NOTE — Progress Notes (Signed)
  Chief Complaint  Patient presents with   Knee Pain    Right     78 year old female presents with 2-1/2-week history of right knee pain.  The patient says she was getting up and turned and felt acute pain medial side right knee.  Examination reveals full extension of the right knee no effusion. Full range of motion. Normal hip exam.  Tenderness over the pes bursa insertion and medial joint line  Equivocal McMurray's  Normal stability  Muscle tone normal  Neurovascular exam intact  X-rays shows mild arthritis of the right knee  DG Knee AP/LAT W/Sunrise Right Result Date: 03/05/2024 X-rays right knee Acute pain right knee Alignment still has reasonable amount of valgus in the right knee.  Mild narrowing of the medial compartment.  No secondary bone changes. No acute fracture No bone lesions Note left knee moderate arthritis medial compartment Impression no acute fracture mild arthritis right knee moderate arthritis left knee    Assessment and plan  Encounter Diagnoses  Name Primary?   Acute pain of right knee Yes   Pes anserinus bursitis of right knee      Pes bursitis right knee, cannot rule out meniscal tear, mild arthritis  Recommend pes bursa injection recheck in 3 weeks  Procedure note right knee injection for bursitis   verbal consent was obtained to inject right knee PES BURSA  Timeout was completed to confirm the site of injection  The medications used were 40 mg of Depo-Medrol  and 1% lidocaine 3 cc  Anesthesia was provided by ethyl chloride and the skin was prepped with alcohol .  After cleaning the skin with alcohol  a 25-gauge needle was used to inject the right knee bursa.  There were no complications and a sterile bandage was applied

## 2024-03-05 NOTE — Progress Notes (Signed)
   Intake history:  BP (!) 152/84   Pulse 78   Ht 5' 2 (1.575 m)   Wt 138 lb (62.6 kg)   BMI 25.24 kg/m  Body mass index is 25.24 kg/m.    WHAT ARE WE SEEING YOU FOR TODAY?   right knee(s)  How long has this bothered you? (DOI?DOS?WS?)  approximately 2 1/2 week(s) ago  Anticoag.  No  Diabetes No  Heart disease No  Hypertension Yes  SMOKING HX No  Kidney disease No  Any ALLERGIES _____________No Known Allergies _________________________________   Treatment:  Have you taken:  Tylenol  No  Advil No  Had PT No  Had injection No  Other  ________________Meloxicam Tramadol  _________

## 2024-03-20 ENCOUNTER — Encounter: Payer: Self-pay | Admitting: Radiology

## 2024-03-26 ENCOUNTER — Ambulatory Visit (INDEPENDENT_AMBULATORY_CARE_PROVIDER_SITE_OTHER): Admitting: Orthopedic Surgery

## 2024-03-26 DIAGNOSIS — M7051 Other bursitis of knee, right knee: Secondary | ICD-10-CM | POA: Diagnosis not present

## 2024-03-26 DIAGNOSIS — M1710 Unilateral primary osteoarthritis, unspecified knee: Secondary | ICD-10-CM | POA: Diagnosis not present

## 2024-03-26 DIAGNOSIS — M1712 Unilateral primary osteoarthritis, left knee: Secondary | ICD-10-CM | POA: Diagnosis not present

## 2024-03-26 NOTE — Patient Instructions (Signed)
 Take extra strength Tylenol  every day and up to 3 times a day if needed

## 2024-03-26 NOTE — Progress Notes (Signed)
     Chief Complaint  Patient presents with   Knee Pain    Both still having stiffness and pain but the do feel better.     Encounter Diagnoses  Name Primary?   Pes anserinus bursitis of right knee Yes   Primary osteoarthritis of left knee    Primary localized osteoarthritis of knee-left     DOI/DOS/ Date:    Improved  78 year old female bilateral osteoarthritis of the knee and pes bursitis of the right knee left knee arthritis is worse but she has made significant improvements just has mild symptoms  Recommend she take Tylenol  every day 500 mg and then up to 3 times a day as needed  Arthritis right and left knee stable Follow-up as needed

## 2024-03-26 NOTE — Progress Notes (Signed)
   There were no vitals taken for this visit.  There is no height or weight on file to calculate BMI.  Chief Complaint  Patient presents with   Knee Pain    Both still having stiffness and pain but the do feel better.     Encounter Diagnosis  Name Primary?   Pes anserinus bursitis of right knee Yes    DOI/DOS/ Date:    Improved

## 2024-04-17 DIAGNOSIS — Z0001 Encounter for general adult medical examination with abnormal findings: Secondary | ICD-10-CM | POA: Diagnosis not present

## 2024-04-17 DIAGNOSIS — E039 Hypothyroidism, unspecified: Secondary | ICD-10-CM | POA: Diagnosis not present

## 2024-04-17 DIAGNOSIS — K219 Gastro-esophageal reflux disease without esophagitis: Secondary | ICD-10-CM | POA: Diagnosis not present

## 2024-04-17 DIAGNOSIS — E7849 Other hyperlipidemia: Secondary | ICD-10-CM | POA: Diagnosis not present

## 2024-04-21 DIAGNOSIS — Z6825 Body mass index (BMI) 25.0-25.9, adult: Secondary | ICD-10-CM | POA: Diagnosis not present

## 2024-04-21 DIAGNOSIS — E039 Hypothyroidism, unspecified: Secondary | ICD-10-CM | POA: Diagnosis not present

## 2024-04-21 DIAGNOSIS — E782 Mixed hyperlipidemia: Secondary | ICD-10-CM | POA: Diagnosis not present

## 2024-04-21 DIAGNOSIS — Z0001 Encounter for general adult medical examination with abnormal findings: Secondary | ICD-10-CM | POA: Diagnosis not present

## 2024-04-21 DIAGNOSIS — Z23 Encounter for immunization: Secondary | ICD-10-CM | POA: Diagnosis not present

## 2024-04-21 DIAGNOSIS — I10 Essential (primary) hypertension: Secondary | ICD-10-CM | POA: Diagnosis not present

## 2024-04-25 DIAGNOSIS — L299 Pruritus, unspecified: Secondary | ICD-10-CM | POA: Diagnosis not present

## 2024-04-25 DIAGNOSIS — Z6825 Body mass index (BMI) 25.0-25.9, adult: Secondary | ICD-10-CM | POA: Diagnosis not present

## 2024-04-28 DIAGNOSIS — Z1231 Encounter for screening mammogram for malignant neoplasm of breast: Secondary | ICD-10-CM | POA: Diagnosis not present

## 2024-05-15 DIAGNOSIS — Z23 Encounter for immunization: Secondary | ICD-10-CM | POA: Diagnosis not present

## 2024-05-15 DIAGNOSIS — Z6825 Body mass index (BMI) 25.0-25.9, adult: Secondary | ICD-10-CM | POA: Diagnosis not present

## 2024-05-15 DIAGNOSIS — T22112A Burn of first degree of left forearm, initial encounter: Secondary | ICD-10-CM | POA: Diagnosis not present

## 2024-05-28 DIAGNOSIS — H00011 Hordeolum externum right upper eyelid: Secondary | ICD-10-CM | POA: Diagnosis not present

## 2024-05-28 DIAGNOSIS — Z6825 Body mass index (BMI) 25.0-25.9, adult: Secondary | ICD-10-CM | POA: Diagnosis not present

## 2024-06-01 ENCOUNTER — Encounter: Payer: Self-pay | Admitting: Radiology

## 2024-06-24 DIAGNOSIS — H40013 Open angle with borderline findings, low risk, bilateral: Secondary | ICD-10-CM | POA: Diagnosis not present

## 2024-06-24 DIAGNOSIS — H40053 Ocular hypertension, bilateral: Secondary | ICD-10-CM | POA: Diagnosis not present

## 2024-07-25 ENCOUNTER — Encounter (HOSPITAL_COMMUNITY): Payer: Self-pay | Admitting: Emergency Medicine

## 2024-07-25 ENCOUNTER — Other Ambulatory Visit: Payer: Self-pay

## 2024-07-25 ENCOUNTER — Emergency Department (HOSPITAL_COMMUNITY)
Admission: EM | Admit: 2024-07-25 | Discharge: 2024-07-25 | Disposition: A | Discharge status: Home / Self Care | Attending: Emergency Medicine | Admitting: Emergency Medicine

## 2024-07-25 ENCOUNTER — Emergency Department (HOSPITAL_COMMUNITY)

## 2024-07-25 DIAGNOSIS — Z79899 Other long term (current) drug therapy: Secondary | ICD-10-CM | POA: Diagnosis not present

## 2024-07-25 DIAGNOSIS — I10 Essential (primary) hypertension: Secondary | ICD-10-CM | POA: Insufficient documentation

## 2024-07-25 DIAGNOSIS — Y92003 Bedroom of unspecified non-institutional (private) residence as the place of occurrence of the external cause: Secondary | ICD-10-CM | POA: Insufficient documentation

## 2024-07-25 DIAGNOSIS — M542 Cervicalgia: Secondary | ICD-10-CM | POA: Insufficient documentation

## 2024-07-25 DIAGNOSIS — R519 Headache, unspecified: Secondary | ICD-10-CM | POA: Diagnosis not present

## 2024-07-25 DIAGNOSIS — W06XXXA Fall from bed, initial encounter: Secondary | ICD-10-CM | POA: Diagnosis not present

## 2024-07-25 HISTORY — DX: Essential (primary) hypertension: I10

## 2024-07-25 HISTORY — DX: Hyperlipidemia, unspecified: E78.5

## 2024-07-25 MED ORDER — ACETAMINOPHEN 500 MG PO TABS
1000.0000 mg | ORAL_TABLET | Freq: Once | ORAL | Status: AC
  Administered 2024-07-25: 1000 mg via ORAL
  Filled 2024-07-25: qty 2

## 2024-07-25 NOTE — Discharge Instructions (Signed)
 We evaluated you for neck pain.  Your CT scan did not show any signs of injury.  Please take 1000 milligrams Tylenol  every 6 hours as needed for pain.  You can also apply ice or heat packs to your neck to help with pain.  Please try to stretch her neck.  Your CT scan showed nonspecific finding of possible thickening of your esophagus.  The radiologist recommends possibly having some other type of testing to evaluate for your esophagus such as an endoscopy.  Please discuss this with your primary doctor.  I do not think this is related to any of your current symptoms.  Please return if you develop any new or worsening symptoms such as trouble swallowing, fevers or chills, pain in your arms or legs, trouble walking, severe headaches, or any other new symptom.

## 2024-07-25 NOTE — ED Triage Notes (Signed)
 Pov c/o fall 1 week ago; pt hit head on the floor. Pt denies LOC. Pt states has loss of neck mobility and pain. Pt ambulatory with NAD

## 2024-07-25 NOTE — ED Provider Notes (Signed)
 " [place] EMERGENCY DEPARTMENT AT [facility] Provider Note  CSN: [id number] Arrival date & time: 07/25/24 1442  Chief Complaint(s) Fall  HPI Ann Ortega is a 78 y.o. female history of hypertension, hyperlipidemia presenting to the emergency department with some neck pain and stiffness.  She reports that she fell out of bed about 1 week ago and hit the back of her head and neck on the ground.  Had worse pain in the bilateral neck.  No headaches, loss of consciousness.  Symptoms are overall improving.  Patient came because the pain was persistent.  She has been taking ibuprofen and reports this helps.  No fevers or chills, weakness in the arms the legs, trouble walking, other symptoms.   Past Medical History Past Medical History:  Diagnosis Date   HLD (hyperlipidemia)    Hypertension    Thyroid  disease    Patient Active Problem List   Diagnosis Date Noted   History of colonic polyps 01/08/2024   Gastroesophageal reflux disease 01/08/2024   Constipation 01/08/2024   Bloating 01/08/2024   PATELLAR DISLOCATION, LEFT 02/13/2010   TEAR M C L 01/09/2010   MEDIAL MENISCUS TEAR, LEFT 01/02/2010   Home Medication(s) Prior to Admission medications  Medication Sig Start Date End Date Taking? Authorizing Provider  acetaminophen  (TYLENOL ) 500 MG tablet Take 500 mg by mouth every 6 (six) hours as needed for mild pain.    [provider]  amLODipine (NORVASC) 2.5 MG tablet Take 2.5 mg by mouth daily. 01/24/24   [provider]  cyclobenzaprine  (FLEXERIL ) 10 MG tablet Take 5-10 mg by mouth 3 (three) times daily as needed. 01/01/24   [provider]  docusate sodium (COLACE) 100 MG capsule Take 100-200 mg by mouth daily as needed for mild constipation.    [provider]  ibuprofen (ADVIL,MOTRIN) 200 MG tablet Take 200 mg by mouth every 6 (six) hours as needed for mild pain or moderate pain.    [provider]  levothyroxine (SYNTHROID,  LEVOTHROID) 50 MCG tablet  01/18/16   [provider]  meloxicam (MOBIC) 15 MG tablet Take 15 mg by mouth daily. 01/01/24   [provider]  pantoprazole  (PROTONIX ) 20 MG tablet Take 1 tablet (20 mg total) by mouth daily. 08/04/17   Zammit, Joseph, MD  rosuvastatin (CRESTOR) 5 MG tablet Take 5 mg by mouth every other day. 12/12/21   [provider]  traMADol  (ULTRAM ) 50 MG tablet Take 1 tablet (50 mg total) by mouth every 6 (six) hours as needed. 08/08/18   Margrette Taft BRAVO, MD                                                                                                                                    Past Surgical History Past Surgical History:  Procedure Laterality Date   BREAST BIOPSY Right    BREAST EXCISIONAL BIOPSY Left    Family History  Family History  Problem Relation Age of Onset   Breast cancer Neg Hx     Social History Social History[1] Allergies Patient has no known allergies.  Review of Systems Review of Systems  All other systems reviewed and are negative.   Physical Exam Vital Signs  I have reviewed the triage vital signs BP (!) 151/76   Pulse 74   Temp 99 F (37.2 C) (Temporal)   Resp 18   Ht 5' 2 (1.575 m)   Wt 69.9 kg   SpO2 100%   BMI 28.17 kg/m  Physical Exam Vitals and nursing note reviewed.  Constitutional:      General: She is not in acute distress.    Appearance: She is well-developed.  HENT:     Head: Normocephalic and atraumatic.     Mouth/Throat:     Mouth: Mucous membranes are moist.  Eyes:     Pupils: Pupils are equal, round, and reactive to light.  Cardiovascular:     Rate and Rhythm: Normal rate and regular rhythm.     Heart sounds: No murmur heard. Pulmonary:     Effort: Pulmonary effort is normal. No respiratory distress.     Breath sounds: Normal breath sounds.  Abdominal:     General: Abdomen is flat.     Palpations: Abdomen is soft.     Tenderness: There is no abdominal tenderness.   Musculoskeletal:     Cervical back: Normal range of motion and neck supple. Tenderness (Mild paraspinal tenderness bilaterally to cervical spine, no midline C T or L-spine tenderness) present. No rigidity.     Right lower leg: No edema.     Left lower leg: No edema.  Skin:    General: Skin is warm and dry.  Neurological:     General: No focal deficit present.     Mental Status: She is alert. Mental status is at baseline.     Comments: 5/5 grip strength bilaterally, no sensory deficit to the bilateral hands  Psychiatric:        Mood and Affect: Mood normal.        Behavior: Behavior normal.     ED Results and Treatments Labs (all labs ordered are listed, but only abnormal results are displayed) Labs Reviewed - No data to display                                                                                                                        Radiology CT Cervical Spine Wo Contrast Result Date: 07/25/2024 EXAM: CT CERVICAL SPINE WITHOUT CONTRAST 07/25/2024 03:31:38 PM TECHNIQUE: CT of the cervical spine was performed without the administration of intravenous contrast. Multiplanar reformatted images are provided for review. Automated exposure control, iterative reconstruction, and/or weight based adjustment of the mA/kV was utilized to reduce the radiation dose to as low as reasonably achievable. COMPARISON: xr cervical spine 06/23/18 CLINICAL HISTORY: Neck trauma (Age >= 65y) FINDINGS: BONES AND ALIGNMENT: No acute fracture or traumatic malalignment. Grade 1  anterolisthesis of C3 on C4. Mild retrolisthesis of C6 on C7. Grade 1 anterolisthesis of C7 on T1. DEGENERATIVE CHANGES: Bulky anterior osteophyte formation. SOFT TISSUES: No prevertebral soft tissue swelling. The esophagus is collapsed with a query of esophageal wall thickening. LUNGS: Azygous fissure is noted. IMPRESSION: 1. No acute cervical spine fracture or traumatic malalignment. 2. Possible esophageal wall thickening,  suboptimally assessed on this examination; consider non-emergent evaluation with endoscopy or dedicated esophageal imaging as clinically appropriate. Electronically signed by: Morgane Naveau MD 07/25/2024 04:04 PM EST RP Workstation: [id number]   CT Head Wo Contrast Result Date: 07/25/2024 EXAM: CT HEAD WITHOUT CONTRAST 07/25/2024 03:31:38 PM TECHNIQUE: CT of the head was performed without the administration of intravenous contrast. Automated exposure control, iterative reconstruction, and/or weight based adjustment of the mA/kV was utilized to reduce the radiation dose to as low as reasonably achievable. COMPARISON: None available. CLINICAL HISTORY: Head trauma, minor (Age >= 65y). FINDINGS: BRAIN AND VENTRICLES: No acute hemorrhage. No evidence of acute infarct. No hydrocephalus. No extra-axial collection. No mass effect or midline shift. ORBITS: No acute abnormality. SINUSES: No acute abnormality. SOFT TISSUES AND SKULL: No acute soft tissue abnormality. No skull fracture. IMPRESSION: 1. No acute intracranial abnormality. Electronically signed by: Morgane Naveau MD 07/25/2024 03:53 PM EST RP Workstation: [id number]    Pertinent labs & imaging results that were available during my care of the patient were reviewed by me and considered in my medical decision making (see MDM for details).  Medications Ordered in ED Medications  acetaminophen  (TYLENOL ) tablet 1,000 mg (has no administration in time range)                                                                                                                                     Procedures Procedures  (including critical care time)  Medical Decision Making / ED Course   MDM:  78 year old presenting with neck pain after falling a week ago.    She reports overall her symptoms have significantly improved from onset.  Examination is reassuring with intact range of motion.  She has some paraspinal tenderness without midline tenderness.   Suspect muscular strain from injury.  No evidence of any head injury and CT scan is negative.  CT scan of the neck without evidence of underlying fracture.  No neurologic deficit appreciable.  No headaches, fevers or chills to suggest occult CNS infection.  Recommended supportive care  Patient also with CT scan showing incidental possible suboptimally evaluated esophageal thickening.  She denies any sore throat or other trouble swallowing.  Recommended PMD follow-up for further care.  Discussed this result with patient.  Will discharge patient to home. All questions answered. Patient comfortable with plan of discharge. Return precautions discussed with patient and specified on the after visit summary.        Imaging Studies ordered: I ordered imaging studies including CT scans  On my interpretation imaging demonstrates no acute  process I independently visualized and interpreted imaging. I agree with the radiologist interpretation   Medicines ordered and prescription drug management: Meds ordered this encounter  Medications   acetaminophen  (TYLENOL ) tablet 1,000 mg    -I have reviewed the patients home medicines and have made adjustments as needed    Reevaluation: After the interventions noted above, I reevaluated the patient and found that their symptoms have improved  Co morbidities that complicate the patient evaluation  Past Medical History:  Diagnosis Date   HLD (hyperlipidemia)    Hypertension    Thyroid  disease       Dispostion: Disposition decision including need for hospitalization was considered, and patient discharged from emergency department.    Final Clinical Impression(s) / ED Diagnoses Final diagnoses:  Neck pain     This chart was dictated using voice recognition software.  Despite best efforts to proofread,  errors can occur which can change the documentation meaning.     [1]  Social History Tobacco Use   Smoking status: Never   Smokeless  tobacco: Never  Substance Use Topics   Alcohol  use: Yes    Comment: occ   Drug use: No     Francesca Elsie CROME, MD 07/25/24 1639  "

## 2024-08-03 ENCOUNTER — Ambulatory Visit: Admitting: Orthopedic Surgery

## 2024-08-03 ENCOUNTER — Encounter: Payer: Self-pay | Admitting: Orthopedic Surgery

## 2024-08-03 DIAGNOSIS — M1712 Unilateral primary osteoarthritis, left knee: Secondary | ICD-10-CM

## 2024-08-03 DIAGNOSIS — M1710 Unilateral primary osteoarthritis, unspecified knee: Secondary | ICD-10-CM

## 2024-08-03 MED ORDER — METHYLPREDNISOLONE ACETATE 40 MG/ML IJ SUSP
40.0000 mg | Freq: Once | INTRAMUSCULAR | Status: AC
  Administered 2024-08-03: 40 mg via INTRA_ARTICULAR

## 2024-08-03 NOTE — Progress Notes (Signed)
 Chief Complaint  Patient presents with   Injections    left knee      Procedure note for injection   Chief Complaint  Patient presents with   Injections    left knee      Encounter Diagnosis  Name Primary?   Primary localized osteoarthritis of knee-left Yes        The patient has consented for injection of the left joint: knee  Medication: Depo-Medrol  40 mg and lidocaine 1%  Time out completed: Yes  The site of injection was cleaned with alcohol  and ethyl chloride.  The injection was given without any complications appropriate precautions were given.

## 2024-08-04 ENCOUNTER — Encounter: Payer: Self-pay | Admitting: Gastroenterology

## 2024-08-06 ENCOUNTER — Ambulatory Visit (INDEPENDENT_AMBULATORY_CARE_PROVIDER_SITE_OTHER): Admitting: Gastroenterology

## 2024-08-06 ENCOUNTER — Encounter: Payer: Self-pay | Admitting: Gastroenterology

## 2024-08-06 VITALS — BP 131/77 | HR 76 | Temp 98.6°F | Ht 62.0 in | Wt 141.6 lb

## 2024-08-06 DIAGNOSIS — K2289 Other specified disease of esophagus: Secondary | ICD-10-CM

## 2024-08-06 DIAGNOSIS — K229 Disease of esophagus, unspecified: Secondary | ICD-10-CM | POA: Insufficient documentation

## 2024-08-06 NOTE — Patient Instructions (Signed)
 We are arranging an upper endoscopy with Dr. Cindie in the near future.  Further recommendations to follow!  It was great meeting you!  It was a pleasure to see you today. I want to create trusting relationships with patients and provide genuine, compassionate, and quality care. I truly value your feedback, so please be on the lookout for a survey regarding your visit with me today. I appreciate your time in completing this!         Therisa MICAEL Stager, PhD, ANP-BC [facility]

## 2024-08-06 NOTE — H&P (View-Only) (Signed)
 "      Gastroenterology Office Note    Referring Provider: Trudy Vaughn FALCON, MD Primary Care Physician:  Trudy Vaughn FALCON, MD  Primary GI: previously Margarete BERKE, Dr Dianna, requesting Dr Cindie   Chief Complaint   Chief Complaint  Patient presents with   New Patient (Initial Visit)    Pt referred for thickening of the esophagus. Not hard to swallow per pt     History of Present Illness   Ann Ortega is a delightful  79 y.o. female presenting today at the request of Trudy Vaughn FALCON, MD due to esophageal wall thickening seen incidentally on CT cervical spine 07/25/24 in the ED. No prior EGD that she is aware.   Will occasionally have mild stomach upset and feels mildly nauseated. She will take water and small amount of baking soda. No dysphagia. Good appetite. Only one or two times many years ago had episode of solid food dysphagia but never happened again. No typical GERD symptoms. Currently not on a PPI.    No prior EGD Colonoscopy by Dr. Dianna at some point.   No FH colon cancer No FH colon polyps No esophageal cancer    Past Medical History:  Diagnosis Date   HLD (hyperlipidemia)    Hypertension    Thyroid  disease     Past Surgical History:  Procedure Laterality Date   BREAST BIOPSY Right    BREAST EXCISIONAL BIOPSY Left     Current Outpatient Medications  Medication Sig Dispense Refill   acetaminophen  (TYLENOL ) 500 MG tablet Take 500 mg by mouth every 6 (six) hours as needed for mild pain.     amLODipine (NORVASC) 2.5 MG tablet Take 2.5 mg by mouth daily.     docusate sodium (COLACE) 100 MG capsule Take 100-200 mg by mouth daily as needed for mild constipation.     ibuprofen (ADVIL,MOTRIN) 200 MG tablet Take 200 mg by mouth every 6 (six) hours as needed for mild pain or moderate pain.     levothyroxine (SYNTHROID, LEVOTHROID) 50 MCG tablet   0   rosuvastatin (CRESTOR) 5 MG tablet Take 5 mg by mouth every other day.     No current  facility-administered medications for this visit.    Allergies as of 08/06/2024   (No Known Allergies)    Family History  Problem Relation Age of Onset   Breast cancer Neg Hx     Social History   Socioeconomic History   Marital status: Married    Spouse name: Not on file   Number of children: Not on file   Years of education: Not on file   Highest education level: Not on file  Occupational History   Not on file  Tobacco Use   Smoking status: Never   Smokeless tobacco: Never  Substance and Sexual Activity   Alcohol  use: Yes    Comment: occ   Drug use: No   Sexual activity: Not on file  Other Topics Concern   Not on file  Social History Narrative   Not on file   Social Drivers of Health   Tobacco Use: Low Risk (08/03/2024)   Patient History    Smoking Tobacco Use: Never    Smokeless Tobacco Use: Never    Passive Exposure: Not on file  Financial Resource Strain: Not on file  Food Insecurity: Not on file  Transportation Needs: Not on file  Physical Activity: Not on file  Stress: Not on file  Social Connections: Not on file  Intimate Partner Violence: Not on file  Depression (PHQ2-9): Not on file  Alcohol  Screen: Not on file  Housing: Not on file  Utilities: Not on file  Health Literacy: Not on file     Review of Systems   Gen: Denies any fever, chills, fatigue, weight loss, lack of appetite.  CV: Denies chest pain, heart palpitations, peripheral edema, syncope.  Resp: Denies shortness of breath at rest or with exertion. Denies wheezing or cough.  GI: Denies dysphagia or odynophagia. Denies jaundice, hematemesis, fecal incontinence. GU : Denies urinary burning, urinary frequency, urinary hesitancy MS: Denies joint pain, muscle weakness, cramps, or limitation of movement.  Derm: Denies rash, itching, dry skin Psych: Denies depression, anxiety, memory loss, and confusion Heme: Denies bruising, bleeding, and enlarged lymph nodes.   Physical Exam   BP  131/77   Pulse 76   Temp 98.6 F (37 C)   Ht 5' 2 (1.575 m)   Wt 141 lb 9.6 oz (64.2 kg)   BMI 25.90 kg/m  General:   Alert and oriented. Pleasant and cooperative. Well-nourished and well-developed.  Head:  Normocephalic and atraumatic. Eyes:  Without icterus Ears:  Normal auditory acuity. Lungs:  Clear to auscultation bilaterally.  Heart:  S1, S2 present without murmurs appreciated.  Abdomen:  +BS, soft, non-tender and non-distended. No HSM noted. No guarding or rebound. No masses appreciated.  Rectal:  Deferred  Msk:  Symmetrical without gross deformities. Normal posture. Extremities:  Without edema. Neurologic:  Alert and  oriented x4;  grossly normal neurologically. Skin:  Intact without significant lesions or rashes. Psych:  Alert and cooperative. Normal mood and affect.   Assessment   Ann Ortega is a delightful female presenting today at the request of Dr. Trudy due to incidental finding of esophageal wall thickening on cervical CT performed in ED.  She has no typical GERD symptoms; she had 2 episodes of solid food dysphagia many years but none since that time.   Currently not on a PPI. Vague dyspepsia rarely, relieved with small amount of baking soda.   Could certainly have atypical GERD with esophagitis. Will need diagnostic EGD in near future to rule out any occult etiology.   I will hold off on PPI until we complete EGD.    PLAN   Proceed with upper endoscopy by Dr. Cindie in near future: the risks, benefits, and alternatives have been discussed with the patient in detail. The patient states understanding and desires to proceed.   May need PPI in the future: holding off on this currently till after EGD   Ann MICAEL Stager, PhD, ANP-BC [facility]    "

## 2024-08-06 NOTE — Progress Notes (Signed)
 "      Gastroenterology Office Note    Referring Provider: Trudy Vaughn FALCON, MD Primary Care Physician:  Trudy Vaughn FALCON, MD  Primary GI: previously Margarete BERKE, Dr Dianna, requesting Dr Cindie   Chief Complaint   Chief Complaint  Patient presents with   New Patient (Initial Visit)    Pt referred for thickening of the esophagus. Not hard to swallow per pt     History of Present Illness   Ann Ortega is a delightful  79 y.o. female presenting today at the request of Trudy Vaughn FALCON, MD due to esophageal wall thickening seen incidentally on CT cervical spine 07/25/24 in the ED. No prior EGD that she is aware.   Will occasionally have mild stomach upset and feels mildly nauseated. She will take water and small amount of baking soda. No dysphagia. Good appetite. Only one or two times many years ago had episode of solid food dysphagia but never happened again. No typical GERD symptoms. Currently not on a PPI.    No prior EGD Colonoscopy by Dr. Dianna at some point.   No FH colon cancer No FH colon polyps No esophageal cancer    Past Medical History:  Diagnosis Date   HLD (hyperlipidemia)    Hypertension    Thyroid  disease     Past Surgical History:  Procedure Laterality Date   BREAST BIOPSY Right    BREAST EXCISIONAL BIOPSY Left     Current Outpatient Medications  Medication Sig Dispense Refill   acetaminophen  (TYLENOL ) 500 MG tablet Take 500 mg by mouth every 6 (six) hours as needed for mild pain.     amLODipine (NORVASC) 2.5 MG tablet Take 2.5 mg by mouth daily.     docusate sodium (COLACE) 100 MG capsule Take 100-200 mg by mouth daily as needed for mild constipation.     ibuprofen (ADVIL,MOTRIN) 200 MG tablet Take 200 mg by mouth every 6 (six) hours as needed for mild pain or moderate pain.     levothyroxine (SYNTHROID, LEVOTHROID) 50 MCG tablet   0   rosuvastatin (CRESTOR) 5 MG tablet Take 5 mg by mouth every other day.     No current  facility-administered medications for this visit.    Allergies as of 08/06/2024   (No Known Allergies)    Family History  Problem Relation Age of Onset   Breast cancer Neg Hx     Social History   Socioeconomic History   Marital status: Married    Spouse name: Not on file   Number of children: Not on file   Years of education: Not on file   Highest education level: Not on file  Occupational History   Not on file  Tobacco Use   Smoking status: Never   Smokeless tobacco: Never  Substance and Sexual Activity   Alcohol  use: Yes    Comment: occ   Drug use: No   Sexual activity: Not on file  Other Topics Concern   Not on file  Social History Narrative   Not on file   Social Drivers of Health   Tobacco Use: Low Risk (08/03/2024)   Patient History    Smoking Tobacco Use: Never    Smokeless Tobacco Use: Never    Passive Exposure: Not on file  Financial Resource Strain: Not on file  Food Insecurity: Not on file  Transportation Needs: Not on file  Physical Activity: Not on file  Stress: Not on file  Social Connections: Not on file  Intimate Partner Violence: Not on file  Depression (PHQ2-9): Not on file  Alcohol  Screen: Not on file  Housing: Not on file  Utilities: Not on file  Health Literacy: Not on file     Review of Systems   Gen: Denies any fever, chills, fatigue, weight loss, lack of appetite.  CV: Denies chest pain, heart palpitations, peripheral edema, syncope.  Resp: Denies shortness of breath at rest or with exertion. Denies wheezing or cough.  GI: Denies dysphagia or odynophagia. Denies jaundice, hematemesis, fecal incontinence. GU : Denies urinary burning, urinary frequency, urinary hesitancy MS: Denies joint pain, muscle weakness, cramps, or limitation of movement.  Derm: Denies rash, itching, dry skin Psych: Denies depression, anxiety, memory loss, and confusion Heme: Denies bruising, bleeding, and enlarged lymph nodes.   Physical Exam   BP  131/77   Pulse 76   Temp 98.6 F (37 C)   Ht 5' 2 (1.575 m)   Wt 141 lb 9.6 oz (64.2 kg)   BMI 25.90 kg/m  General:   Alert and oriented. Pleasant and cooperative. Well-nourished and well-developed.  Head:  Normocephalic and atraumatic. Eyes:  Without icterus Ears:  Normal auditory acuity. Lungs:  Clear to auscultation bilaterally.  Heart:  S1, S2 present without murmurs appreciated.  Abdomen:  +BS, soft, non-tender and non-distended. No HSM noted. No guarding or rebound. No masses appreciated.  Rectal:  Deferred  Msk:  Symmetrical without gross deformities. Normal posture. Extremities:  Without edema. Neurologic:  Alert and  oriented x4;  grossly normal neurologically. Skin:  Intact without significant lesions or rashes. Psych:  Alert and cooperative. Normal mood and affect.   Assessment   Ann Ortega is a delightful female presenting today at the request of Dr. Trudy due to incidental finding of esophageal wall thickening on cervical CT performed in ED.  She has no typical GERD symptoms; she had 2 episodes of solid food dysphagia many years but none since that time.   Currently not on a PPI. Vague dyspepsia rarely, relieved with small amount of baking soda.   Could certainly have atypical GERD with esophagitis. Will need diagnostic EGD in near future to rule out any occult etiology.   I will hold off on PPI until we complete EGD.    PLAN   Proceed with upper endoscopy by Dr. Cindie in near future: the risks, benefits, and alternatives have been discussed with the patient in detail. The patient states understanding and desires to proceed.   May need PPI in the future: holding off on this currently till after EGD   Ann MICAEL Stager, PhD, ANP-BC [facility]    "

## 2024-08-07 ENCOUNTER — Telehealth: Payer: Self-pay | Admitting: *Deleted

## 2024-08-07 NOTE — Telephone Encounter (Signed)
 [employer] PA: Approved Authorization #[id number]  Tracking [id number] Dates of service 08/13/2024 - 11/11/2024

## 2024-08-10 ENCOUNTER — Encounter: Payer: Self-pay | Admitting: *Deleted

## 2024-08-13 ENCOUNTER — Other Ambulatory Visit: Payer: Self-pay

## 2024-08-13 ENCOUNTER — Encounter (HOSPITAL_COMMUNITY): Payer: Self-pay | Admitting: Internal Medicine

## 2024-08-13 ENCOUNTER — Encounter (HOSPITAL_COMMUNITY): Admission: RE | Disposition: A | Payer: Self-pay | Source: Home / Self Care | Attending: Internal Medicine

## 2024-08-13 ENCOUNTER — Encounter (HOSPITAL_COMMUNITY): Payer: Self-pay | Admitting: Anesthesiology

## 2024-08-13 ENCOUNTER — Ambulatory Visit (HOSPITAL_COMMUNITY)
Admission: RE | Admit: 2024-08-13 | Discharge: 2024-08-13 | Disposition: A | Discharge status: Home / Self Care | Attending: Internal Medicine | Admitting: Internal Medicine

## 2024-08-13 ENCOUNTER — Ambulatory Visit (HOSPITAL_COMMUNITY): Payer: Self-pay | Admitting: Anesthesiology

## 2024-08-13 DIAGNOSIS — R933 Abnormal findings on diagnostic imaging of other parts of digestive tract: Secondary | ICD-10-CM | POA: Insufficient documentation

## 2024-08-13 DIAGNOSIS — K2270 Barrett's esophagus without dysplasia: Secondary | ICD-10-CM | POA: Diagnosis not present

## 2024-08-13 DIAGNOSIS — K222 Esophageal obstruction: Secondary | ICD-10-CM | POA: Insufficient documentation

## 2024-08-13 DIAGNOSIS — I10 Essential (primary) hypertension: Secondary | ICD-10-CM | POA: Diagnosis not present

## 2024-08-13 DIAGNOSIS — K31A0 Gastric intestinal metaplasia, unspecified: Secondary | ICD-10-CM | POA: Insufficient documentation

## 2024-08-13 DIAGNOSIS — R948 Abnormal results of function studies of other organs and systems: Secondary | ICD-10-CM | POA: Diagnosis present

## 2024-08-13 DIAGNOSIS — K449 Diaphragmatic hernia without obstruction or gangrene: Secondary | ICD-10-CM | POA: Diagnosis not present

## 2024-08-13 HISTORY — PX: ESOPHAGOGASTRODUODENOSCOPY: SHX5428

## 2024-08-13 MED ORDER — LACTATED RINGERS IV SOLN: INTRAVENOUS | Status: DC

## 2024-08-13 MED ORDER — PROPOFOL 10 MG/ML IV BOLUS
INTRAVENOUS | Status: DC | PRN
  Administered 2024-08-13: 20 mg via INTRAVENOUS
  Administered 2024-08-13: 100 mg via INTRAVENOUS

## 2024-08-13 MED ORDER — LIDOCAINE 2% (20 MG/ML) 5 ML SYRINGE
INTRAMUSCULAR | Status: DC | PRN
  Administered 2024-08-13: 60 mg via INTRAVENOUS

## 2024-08-13 MED ORDER — PANTOPRAZOLE SODIUM 40 MG PO TBEC: 40.0000 mg | DELAYED_RELEASE_TABLET | Freq: Every day | ORAL | 11 refills | Status: AC

## 2024-08-13 NOTE — Anesthesia Preprocedure Evaluation (Signed)
"                                    Anesthesia Evaluation  Patient identified by MRN, date of birth, ID band Patient awake    Reviewed: Allergy & Precautions, H&P , NPO status , Patient's Chart, lab work & pertinent test results, reviewed documented beta blocker date and time   Airway Mallampati: II  TM Distance: >3 FB Neck ROM: full    Dental no notable dental hx.    Pulmonary neg pulmonary ROS   Pulmonary exam normal breath sounds clear to auscultation       Cardiovascular Exercise Tolerance: Good hypertension, negative cardio ROS  Rhythm:regular Rate:Normal     Neuro/Psych negative neurological ROS  negative psych ROS   GI/Hepatic negative GI ROS, Neg liver ROS,GERD  ,,  Endo/Other  negative endocrine ROS    Renal/GU negative Renal ROS  negative genitourinary   Musculoskeletal   Abdominal   Peds  Hematology negative hematology ROS (+)   Anesthesia Other Findings   Reproductive/Obstetrics negative OB ROS                              Anesthesia Physical Anesthesia Plan  ASA: 2  Anesthesia Plan: MAC   Post-op Pain Management:    Induction:   PONV Risk Score and Plan: Propofol  infusion  Airway Management Planned:   Additional Equipment:   Intra-op Plan:   Post-operative Plan:   Informed Consent: I have reviewed the patients History and Physical, chart, labs and discussed the procedure including the risks, benefits and alternatives for the proposed anesthesia with the patient or authorized representative who has indicated his/her understanding and acceptance.     Dental Advisory Given  Plan Discussed with: CRNA  Anesthesia Plan Comments:          Anesthesia Quick Evaluation  "

## 2024-08-13 NOTE — Discharge Instructions (Addendum)
 EGD Discharge instructions Please read the instructions outlined below and refer to this sheet in the next few weeks. These discharge instructions provide you with general information on caring for yourself after you leave the hospital. Your doctor may also give you specific instructions. While your treatment has been planned according to the most current medical practices available, unavoidable complications occasionally occur. If you have any problems or questions after discharge, please call your doctor. ACTIVITY You may resume your regular activity but move at a slower pace for the next 24 hours.  Take frequent rest periods for the next 24 hours.  Walking will help expel (get rid of) the air and reduce the bloated feeling in your abdomen.  No driving for 24 hours (because of the anesthesia (medicine) used during the test).  You may shower.  Do not sign any important legal documents or operate any machinery for 24 hours (because of the anesthesia used during the test).  NUTRITION Drink plenty of fluids.  You may resume your normal diet.  Begin with a light meal and progress to your normal diet.  Avoid alcoholic beverages for 24 hours or as instructed by your caregiver.  MEDICATIONS You may resume your normal medications unless your caregiver tells you otherwise.  WHAT YOU CAN EXPECT TODAY You may experience abdominal discomfort such as a feeling of fullness or gas pains.  FOLLOW-UP Your doctor will discuss the results of your test with you.  SEEK IMMEDIATE MEDICAL ATTENTION IF ANY OF THE FOLLOWING OCCUR: Excessive nausea (feeling sick to your stomach) and/or vomiting.  Severe abdominal pain and distention (swelling).  Trouble swallowing.  Temperature over 101 F (37.8 C).  Rectal bleeding or vomiting of blood.   Your upper endoscopy revealed a small hiatal hernia. You also had a moderate tightening of your esophagus called a Schatzki's ring.  This is a benign ring related to acid  reflux.  I stretched this out today just by passing the endoscope through.  I also took samples, we will call with these results in 1 to 2 weeks.  Stomach and small bowel were normal.  I am going to start you on pantoprazole  40 mg daily.  Follow-up in GI office in 2 to 3 months.    I hope you have a great rest of your week!  Carlin POUR. Cindie, D.O. Gastroenterology and Hepatology [facility]

## 2024-08-13 NOTE — Interval H&P Note (Signed)
 History and Physical Interval Note:  08/13/2024 11:38 AM  Ann Ortega  has presented today for surgery, with the diagnosis of esophageal wall thickening.  The various methods of treatment have been discussed with the patient and family. After consideration of risks, benefits and other options for treatment, the patient has consented to  Procedures with comments: EGD (ESOPHAGOGASTRODUODENOSCOPY) (N/A) - 11:30 am, asa 2 as a surgical intervention.  The patient's history has been reviewed, patient examined, no change in status, stable for surgery.  I have reviewed the patient's chart and labs.  Questions were answered to the patient's satisfaction.     Carlin MARLA Hasty

## 2024-08-13 NOTE — Op Note (Signed)
 [facility] Patient Name: Ann Ortega Procedure Date: 08/13/2024 11:36 AM MRN: [id number] Date of Birth: 1945/09/04 Attending MD: Carlin POUR. Cindie , [place], [id number] CSN: [id number] Age: 79 Admit Type: Outpatient Procedure:                Upper GI endoscopy Indications:              Abnormal CT of the GI tract (esophageal thickening) Providers:                Carlin POUR. Cindie, DO, Devere Lodge, Daphne Mulch                            Technician, Technician Referring MD:             Carlin POUR. Cindie, DO Medicines:                See the Anesthesia note for documentation of the                            administered medications Complications:            No immediate complications. Estimated Blood Loss:     Estimated blood loss was minimal. Procedure:                Pre-Anesthesia Assessment:                           - The anesthesia plan was to use monitored                            anesthesia care (MAC).                           After obtaining informed consent, the endoscope was                            passed under direct vision. Throughout the                            procedure, the patient's blood pressure, pulse, and                            oxygen saturations were monitored continuously. The                            [id number] ([id number]) Upper was introduced through                            the mouth, and advanced to the second part of                            duodenum. The upper GI endoscopy was accomplished                            without difficulty. The patient tolerated the  procedure well. Scope In: 11:47:21 AM Scope Out: 11:50:38 AM Total Procedure Duration: 0 hours 3 minutes 17 seconds  Findings:      A small hiatal hernia was present.      A moderate to severe Schatzki ring was found in the distal esophagus.       Mild mucosal disruption occurred with passage of endoscope. Biopsies       were taken with a cold forceps for  histology.      The entire examined stomach was normal.      The duodenal bulb, first portion of the duodenum and second portion of       the duodenum were normal. Impression:               - Small hiatal hernia.                           - Moderate Schatzki ring. Biopsied.                           - Normal stomach.                           - Normal duodenal bulb, first portion of the                            duodenum and second portion of the duodenum. Moderate Sedation:      Per Anesthesia Care Recommendation:           - Patient has a contact number available for                            emergencies. The signs and symptoms of potential                            delayed complications were discussed with the                            patient. Return to normal activities tomorrow.                            Written discharge instructions were provided to the                            patient.                           - Resume previous diet.                           - Continue present medications.                           - Await pathology results.                           - Repeat upper endoscopy PRN for esophageal  dilation. It is interesting that patient is not                            having more dysphagia given severity of her ring.                           - Return to GI clinic in 3 months.                           - Use Protonix  (pantoprazole ) 40 mg PO daily. Procedure Code(s):        --- Professional ---                           [phone], Esophagogastroduodenoscopy, flexible,                            transoral; with biopsy, single or multiple Diagnosis Code(s):        --- Professional ---                           K44.9, Diaphragmatic hernia without obstruction or                            gangrene                           K22.2, Esophageal obstruction                           R93.3, Abnormal findings on diagnostic imaging of                             other parts of digestive tract CPT copyright 2022 [employer]. All rights reserved. The codes documented in this report are preliminary and upon coder review may  be revised to meet current compliance requirements. Carlin POUR. Cindie, DO Carlin POUR. Cindie, DO 08/13/2024 11:56:35 AM This report has been signed electronically. Number of Addenda: 0

## 2024-08-13 NOTE — Transfer of Care (Signed)
 Immediate Anesthesia Transfer of Care Note  Patient: Ann Ortega  Procedure(s) Performed: EGD (ESOPHAGOGASTRODUODENOSCOPY)  Patient Location: Endoscopy Unit  Anesthesia Type:General  Level of Consciousness: drowsy  Airway & Oxygen Therapy: Patient Spontanous Breathing  Post-op Assessment: Report given to RN and Post -op Vital signs reviewed and stable  Post vital signs: Reviewed and stable  Last Vitals:  Vitals Value Taken Time  BP 103/56   Temp 36.4 C 08/13/24 11:53  Pulse 71 08/13/24 11:53  Resp 13 08/13/24 11:53  SpO2 95 % 08/13/24 11:53    Last Pain:  Vitals:   08/13/24 1153  TempSrc: Oral  PainSc:       Patients Stated Pain Goal: 6 (08/13/24 1030)  Complications: No notable events documented.

## 2024-08-14 ENCOUNTER — Ambulatory Visit: Payer: Self-pay | Admitting: Internal Medicine

## 2024-08-14 ENCOUNTER — Encounter (HOSPITAL_COMMUNITY): Payer: Self-pay | Admitting: Internal Medicine

## 2024-08-14 LAB — SURGICAL PATHOLOGY

## 2024-08-14 NOTE — Anesthesia Postprocedure Evaluation (Signed)
"   Anesthesia Post Note  Patient: Ann Ortega  Procedure(s) Performed: EGD (ESOPHAGOGASTRODUODENOSCOPY)  Patient location during evaluation: Phase II Anesthesia Type: MAC Level of consciousness: awake Pain management: pain level controlled Vital Signs Assessment: post-procedure vital signs reviewed and stable Respiratory status: spontaneous breathing and respiratory function stable Cardiovascular status: blood pressure returned to baseline and stable Postop Assessment: no headache and no apparent nausea or vomiting Anesthetic complications: no Comments: Late entry   No notable events documented.   Last Vitals:  Vitals:   08/13/24 1157 08/13/24 1204  BP: (!) 101/53 119/66  Pulse: 66 64  Resp: 18 19  Temp:    SpO2: 94% 95%    Last Pain:  Vitals:   08/13/24 1204  TempSrc:   PainSc: 0-No pain                 Ann Ortega      "
